# Patient Record
Sex: Female | Born: 1940 | Race: White | Hispanic: No | Marital: Married | Smoking: Never smoker
Health system: Southern US, Community
[De-identification: ages and names within clinical notes are randomized; demographics above are authoritative.]

## PROBLEM LIST (undated history)

## (undated) DIAGNOSIS — I10 Essential (primary) hypertension: Secondary | ICD-10-CM

## (undated) DIAGNOSIS — E039 Hypothyroidism, unspecified: Secondary | ICD-10-CM

## (undated) DIAGNOSIS — M1990 Unspecified osteoarthritis, unspecified site: Secondary | ICD-10-CM

## (undated) HISTORY — DX: Hypothyroidism, unspecified: E03.9

## (undated) HISTORY — PX: ANTERIOR CRUCIATE LIGAMENT REPAIR: SHX115

## (undated) HISTORY — DX: Unspecified osteoarthritis, unspecified site: M19.90

## (undated) HISTORY — PX: CHOLECYSTECTOMY: SHX55

## (undated) HISTORY — PX: ANKLE SURGERY: SHX546

## (undated) HISTORY — PX: EYE SURGERY: SHX253

## (undated) HISTORY — PX: TOTAL HIP ARTHROPLASTY: SHX124

## (undated) HISTORY — PX: CATARACT EXTRACTION: SUR2

## (undated) HISTORY — PX: KNEE ARTHROSCOPY: SHX127

## (undated) HISTORY — DX: Essential (primary) hypertension: I10

---

## 1999-04-12 ENCOUNTER — Other Ambulatory Visit: Admission: RE | Admit: 1999-04-12 | Discharge: 1999-04-12 | Payer: Self-pay | Admitting: Family Medicine

## 2000-04-21 ENCOUNTER — Other Ambulatory Visit: Admission: RE | Admit: 2000-04-21 | Discharge: 2000-04-21 | Payer: Self-pay | Admitting: Family Medicine

## 2001-04-23 ENCOUNTER — Other Ambulatory Visit: Admission: RE | Admit: 2001-04-23 | Discharge: 2001-04-23 | Payer: Self-pay | Admitting: Family Medicine

## 2001-09-07 ENCOUNTER — Emergency Department (HOSPITAL_COMMUNITY): Admission: EM | Admit: 2001-09-07 | Discharge: 2001-09-07 | Payer: Self-pay | Admitting: Emergency Medicine

## 2002-04-29 ENCOUNTER — Other Ambulatory Visit: Admission: RE | Admit: 2002-04-29 | Discharge: 2002-04-29 | Payer: Self-pay | Admitting: Family Medicine

## 2003-06-09 ENCOUNTER — Other Ambulatory Visit: Admission: RE | Admit: 2003-06-09 | Discharge: 2003-06-09 | Payer: Self-pay | Admitting: Family Medicine

## 2004-06-17 ENCOUNTER — Other Ambulatory Visit: Admission: RE | Admit: 2004-06-17 | Discharge: 2004-06-17 | Payer: Self-pay | Admitting: Family Medicine

## 2004-07-29 ENCOUNTER — Ambulatory Visit: Payer: Self-pay | Admitting: Family Medicine

## 2004-08-01 ENCOUNTER — Ambulatory Visit: Payer: Self-pay | Admitting: Family Medicine

## 2004-09-04 ENCOUNTER — Ambulatory Visit: Payer: Self-pay | Admitting: Family Medicine

## 2005-02-20 ENCOUNTER — Ambulatory Visit: Payer: Self-pay | Admitting: Family Medicine

## 2005-02-28 ENCOUNTER — Ambulatory Visit: Payer: Self-pay | Admitting: Cardiology

## 2005-03-17 ENCOUNTER — Ambulatory Visit: Payer: Self-pay

## 2005-03-17 ENCOUNTER — Ambulatory Visit: Payer: Self-pay | Admitting: Cardiology

## 2005-03-20 ENCOUNTER — Ambulatory Visit: Payer: Self-pay | Admitting: Cardiology

## 2005-03-31 ENCOUNTER — Ambulatory Visit: Payer: Self-pay | Admitting: Internal Medicine

## 2005-03-31 ENCOUNTER — Ambulatory Visit (HOSPITAL_COMMUNITY): Admission: RE | Admit: 2005-03-31 | Discharge: 2005-03-31 | Payer: Self-pay | Admitting: Cardiology

## 2005-06-19 ENCOUNTER — Ambulatory Visit: Payer: Self-pay | Admitting: Cardiology

## 2005-07-07 ENCOUNTER — Other Ambulatory Visit: Admission: RE | Admit: 2005-07-07 | Discharge: 2005-07-07 | Payer: Self-pay | Admitting: Family Medicine

## 2005-07-07 ENCOUNTER — Ambulatory Visit: Payer: Self-pay | Admitting: Family Medicine

## 2005-10-17 ENCOUNTER — Ambulatory Visit: Payer: Self-pay | Admitting: Family Medicine

## 2005-12-11 ENCOUNTER — Ambulatory Visit: Payer: Self-pay | Admitting: Cardiology

## 2007-05-03 ENCOUNTER — Ambulatory Visit: Payer: Self-pay | Admitting: Cardiology

## 2007-05-07 ENCOUNTER — Ambulatory Visit: Payer: Self-pay

## 2007-05-07 LAB — CONVERTED CEMR LAB
BUN: 23 mg/dL (ref 6–23)
CO2: 29 meq/L (ref 19–32)
Creatinine, Ser: 0.9 mg/dL (ref 0.4–1.2)
Potassium: 5.3 meq/L — ABNORMAL HIGH (ref 3.5–5.1)
Sodium: 138 meq/L (ref 135–145)

## 2007-08-03 ENCOUNTER — Ambulatory Visit: Payer: Self-pay | Admitting: Cardiology

## 2007-11-30 ENCOUNTER — Ambulatory Visit: Payer: Self-pay | Admitting: Cardiology

## 2008-03-20 ENCOUNTER — Ambulatory Visit: Payer: Self-pay | Admitting: Cardiology

## 2008-05-18 ENCOUNTER — Ambulatory Visit: Payer: Self-pay | Admitting: Cardiology

## 2008-08-08 ENCOUNTER — Ambulatory Visit: Payer: Self-pay | Admitting: Cardiology

## 2008-08-24 ENCOUNTER — Ambulatory Visit: Payer: Self-pay | Admitting: Cardiology

## 2008-08-26 DIAGNOSIS — I10 Essential (primary) hypertension: Secondary | ICD-10-CM | POA: Insufficient documentation

## 2008-08-26 DIAGNOSIS — E119 Type 2 diabetes mellitus without complications: Secondary | ICD-10-CM | POA: Insufficient documentation

## 2008-08-26 DIAGNOSIS — E039 Hypothyroidism, unspecified: Secondary | ICD-10-CM | POA: Insufficient documentation

## 2008-10-13 ENCOUNTER — Ambulatory Visit: Payer: Self-pay | Admitting: Cardiology

## 2008-12-19 ENCOUNTER — Ambulatory Visit: Payer: Self-pay | Admitting: Cardiology

## 2008-12-19 DIAGNOSIS — E669 Obesity, unspecified: Secondary | ICD-10-CM

## 2009-04-11 ENCOUNTER — Encounter (INDEPENDENT_AMBULATORY_CARE_PROVIDER_SITE_OTHER): Payer: Self-pay | Admitting: *Deleted

## 2009-05-28 ENCOUNTER — Encounter: Payer: Self-pay | Admitting: Cardiology

## 2009-06-21 ENCOUNTER — Ambulatory Visit: Payer: Self-pay | Admitting: Cardiology

## 2010-03-21 ENCOUNTER — Telehealth: Payer: Self-pay | Admitting: Cardiology

## 2010-04-17 ENCOUNTER — Ambulatory Visit: Payer: Self-pay | Admitting: Cardiology

## 2010-04-17 DIAGNOSIS — R0602 Shortness of breath: Secondary | ICD-10-CM

## 2010-08-09 ENCOUNTER — Encounter: Payer: Self-pay | Admitting: Cardiology

## 2010-08-09 ENCOUNTER — Ambulatory Visit: Payer: Self-pay | Admitting: Cardiology

## 2010-09-24 NOTE — Progress Notes (Signed)
  Phone Note Call from Patient   Caller: Patient Reason for Call: Talk to Nurse Summary of Call: pt stopped taking bp med corig caused her to feel like she will pass out,  now on diavan, having sob x 2 months, pls advise [phone] Initial call taken by: Glynda Jaeger,  March 21, 2010 3:27 PM

## 2010-09-24 NOTE — Progress Notes (Signed)
Summary: sob x 2 months/ appt 8/24  Phone Note Call from Patient   Caller: Patient Reason for Call: Talk to Nurse Summary of Call: pt stopped taking corig because she felt like she was going to pass out, now on diavan and having sob x 2 months -pls advise [phone] Initial call taken by: Glynda Jaeger,  March 21, 2010 4:16 PM  Follow-up for Phone Call        She needs a follow up appt which could be with an extender. Follow-up by: Rollene Rotunda, MD, [facility],  March 22, 2010 2:15 PM  Additional Follow-up for Phone Call Additional follow up Details #1::        SOB even with sitting in the bed but wants to wait to see Dr Antoine Poche for evaluation.  Some edema as well that doesn't resolve over night.  Attempted to give pt an appointment with PA on Tuesday.  Pt states understanding tp call back or go tot he ED if s/s get worse or change. Additional Follow-up by: Charolotte Capuchin, RN,  March 22, 2010 3:24 PM

## 2010-09-24 NOTE — Assessment & Plan Note (Signed)
Summary: appt at 3:15 OK per [facility]   Visit Type:  Follow-up Primary Provider:  Dr. Dina Rich  CC:  Hypertension.  History of Present Illness: The patient presents for followup of her difficult to control hypertension.  She had been doing relatively well until she had an episode of dizziness and lightheadedness. She noted blood pressures that were in the 100 systolic range. She weaned herself off of her carvedilol. She subsequently developed significant shortness of breath and presented to her endocrinologist. She was found to be tachycardic at that time and she was restarted on her carvedilol. She did have improvement in her symptoms. Her blood pressures have been well controlled and I reviewed a blood pressure diary. She has however had some increased dyspnea. She is not describing resting shortness of breath and doesn't have PND or orthopnea. She is not describing palpitations, presyncope or syncope. She's had no chest pressure, neck or arm discomfort.  Current Medications (verified): 1)  Carvedilol 25 Mg Tabs (Carvedilol) .... Take 1 Tablet By Mouth Twice A Day 2)  Diovan Hct 320-25 Mg Tabs (Valsartan-Hydrochlorothiazide) .Marland Kitchen.. 1 Tab Once Daily 3)  Synthroid 50 Mcg Tabs (Levothyroxine Sodium) .Marland Kitchen.. 1 Tab Once Daily 4)  Metformin Hcl 500 Mg Xr24h-Tab (Metformin Hcl) .... Take 2 Tab Two Times A Day 5)  Lantus 100 Unit/ml Soln (Insulin Glargine) .... Take 65 Units At Bedtime 6)  Amaryl 4 Mg Tabs (Glimepiride) .Marland Kitchen.. 1 Tab Two Times A Day 7)  Humalog Mix 50/50 Kwikpen 50-50 % Susp (Insulin Lispro Prot & Lispro) .... Take 30units Two Times A Day 8)  Torsemide 100 Mg Tabs (Torsemide) .... Takke 1/4 Tab Prn 9)  Prilosec 20 Mg Cpdr (Omeprazole) .... As Needed  Allergies (verified): 1)  ! * Actos 2)  ! * Avandia 3)  ! * Januvia 4)  ! * Altace 5)  ! * Symelin 6)  ! * Byetta 7)  ! * Statins 8)  ! Zetia (Ezetimibe) 9)  ! * Mobic 10)  ! Ultram 11)  ! * Antara 12)  ! Atenolol 13)  ! * Aleve 14)   ! Norvasc 15)  ! * Triamcelene Ointment  Past History:  Past Medical History:  1) Diabetes mellitus x12 years.   2) Hypothyroidism.   3) Hypertension.            Past Surgical History: Reviewed history from 08/26/2008 and no changes required. 1)  Right ankle surgery 2)  Left knee arthroscopic surgery 3)  Left hip replacement,  4)  Right knee surgery.      Review of Systems       As stated in the HPI and negative for all other systems.   Vital Signs:  Patient profile:   70 year old female Height:      68 inches Weight:      263 pounds BMI:     40.13 Pulse rate:   95 / minute Resp:     20 per minute BP sitting:   144 / 64  (right arm)  Vitals Entered By: Marrion Coy, CNA (April 17, 2010 3:02 PM)  Physical Exam  General:  Well developed, well nourished, in no acute distress. Head:  normocephalic and atraumatic Eyes:  PERRLA/EOM intact; conjunctiva and lids normal. Mouth:  Teeth, gums and palate normal. Oral mucosa normal. Neck:  Neck supple, no JVD. No masses, thyromegaly or abnormal cervical nodes. Chest Wall:  no deformities or breast masses noted Lungs:  Clear bilaterally to auscultation and  percussion. Heart:  Non-displaced PMI, chest non-tender; regular rate and rhythm, S1, S2 without murmurs, rubs or gallops. Carotid upstroke normal, no bruit. Normal abdominal aortic size, no bruits. Femorals normal pulses, no bruits. Pedals normal pulses. Trace edema, no varicosities. Abdomen:  Bowel sounds positive; abdomen soft and non-tender without masses, organomegaly, or hernias noted. No hepatosplenomegaly, obese Msk:  Back normal, normal gait. Muscle strength and tone normal. Extremities:  No clubbing or cyanosis. Neurologic:  Alert and oriented x 3. Skin:  Intact without lesions or rashes. Cervical Nodes:  no significant adenopathy Psych:  Normal affect.    EKG  Procedure date:  04/17/2010  Findings:      Sinus rhythm, rate 95, axis within normal limits,  intervals within normal limits, no acute ST-T wave changes  Impression & Recommendations:  Problem # 1:  ESSENTIAL HYPERTENSION, MALIGNANT (ICD-401.0) Her blood pressure is controlled while she is on the meds as listed. I will not make a change though we might titrate upward based on future blood pressure diary readings. She will continue to keep these. She understands the need for weight loss to contribute to her blood pressure control. Orders: EKG w/ Interpretation (93000) TLB-BNP (B-Natriuretic Peptide) (83880-BNPR)  Problem # 2:  DYSPNEA (ICD-786.05) This is an increasing complaint. I will start by checking a BNP level.  Problem # 3:  UNSPECIFIED HYPOTHYROIDISM (ICD-244.9) She has this followed by her endocrinologist and says her labs have been within normal limits.  Patient Instructions: 1)  Your physician recommends that you schedule a follow-up appointment in: 4 months with Dr Antoine Poche 2)  Your physician recommends that you return for lab work today: BNP 3)  Your physician recommends that you continue on your current medications as directed. Please refer to the Current Medication list given to you today.

## 2010-09-26 NOTE — Assessment & Plan Note (Signed)
Summary: 4 MONTH ROV.SL   Visit Type:  Follow-up Primary [place] Charland:  Dr. Dina Rich  CC:  HTN.  History of Present Illness: The patient presents for followup of hypertension and dyspnea. At the last visit I checked a BMP which was normal. She continues to get episodic dyspnea. Discussing this today it is clear that she doesn't watch salt in her diet and doesn't exercise. She says her blood pressure has been controlled. She has not been having any PND or orthopnea. She has not been having any palpitations, presyncope or syncope. She had no chest pressure, neck or arm discomfort. She rarely has to take torsemide for swelling or dyspnea.  Current Medications (verified): 1)  Carvedilol 25 Mg Tabs (Carvedilol) .... Take 1 Tablet By Mouth Twice A Day 2)  Diovan Hct 320-25 Mg Tabs (Valsartan-Hydrochlorothiazide) .Marland Kitchen.. 1 Tab Once Daily 3)  Synthroid 50 Mcg Tabs (Levothyroxine Sodium) .Marland Kitchen.. 1 Tab Once Daily 4)  Metformin Hcl 500 Mg Xr24h-Tab (Metformin Hcl) .... Take 2 Tab Two Times A Day 5)  Lantus 100 Unit/ml Soln (Insulin Glargine) .... Take 65 Units At Bedtime 6)  Amaryl 4 Mg Tabs (Glimepiride) .Marland Kitchen.. 1 Tab Two Times A Day 7)  Humalog Mix 50/50 Kwikpen 50-50 % Susp (Insulin Lispro Prot & Lispro) .... Take 30units Two Times A Day 8)  Torsemide 100 Mg Tabs (Torsemide) .... Takke 1/4 Tab Prn 9)  Prilosec 20 Mg Cpdr (Omeprazole) .... As Needed 10)  Meclizine Hcl 25 Mg Tabs (Meclizine Hcl) .... As Needed 11)  Septra Ds 800-160 Mg Tabs (Sulfamethoxazole-Trimethoprim) .Marland Kitchen.. 1 By Mouth Two Times A Day  Allergies (verified): 1)  ! * Actos 2)  ! * Avandia 3)  ! * Januvia 4)  ! * Altace 5)  ! * Symelin 6)  ! * Byetta 7)  ! * Statins 8)  ! Zetia (Ezetimibe) 9)  ! * Mobic 10)  ! Ultram 11)  ! * Antara 12)  ! Atenolol 13)  ! * Aleve 14)  ! Norvasc 15)  ! * Triamcelene Ointment  Past History:  Past Medical History: Reviewed history from 04/17/2010 and no changes required.  1) Diabetes mellitus  x12 years.   2) Hypothyroidism.   3) Hypertension.            Past Surgical History: Reviewed history from 08/26/2008 and no changes required. 1)  Right ankle surgery 2)  Left knee arthroscopic surgery 3)  Left hip replacement,  4)  Right knee surgery.      Review of Systems       As stated in the HPI and negative for all other systems.   Vital Signs:  Patient profile:   70 year old female Height:      68 inches Weight:      268 pounds BMI:     40.90 Pulse rate:   115 / minute Resp:     16 per minute BP sitting:   139 / 68  (right arm)  Vitals Entered By: Marrion Coy, CNA (August 09, 2010 4:19 PM)  Physical Exam  General:  Well developed, well nourished, in no acute distress. Head:  normocephalic and atraumatic Mouth:  Teeth, gums and palate normal. Oral mucosa normal. Neck:  Neck supple, no JVD. No masses, thyromegaly or abnormal cervical nodes. Lungs:  Clear bilaterally to auscultation and percussion. Heart:  Non-displaced PMI, chest non-tender; regular rate and rhythm, S1, S2 without murmurs, rubs or gallops.  Abdomen:  Bowel sounds positive; abdomen soft and  non-tender without masses, organomegaly, or hernias noted. No hepatosplenomegaly, obese Msk:  Back normal, normal gait. Muscle strength and tone normal. Pulses:  pulses normal in all 4 extremities Extremities:  mild right greater than left lower extremity edema Neurologic:  Alert and oriented x 3. Skin:  Intact without lesions or rashes. Cervical Nodes:  no significant adenopathy Psych:  Normal affect.    EKG  Procedure date:  08/09/2010  Findings:      Sinus tachycardia, rate 100, axis within normal limits, QTC slightly prolonged, low voltage in chest leads, baseline artifact precludes adequate analysis  Impression & Recommendations:  Problem # 1:  DYSPNEA (ICD-786.05) I suspect that this is related predominantly to weight and deconditioning though soft and dietary indiscretion could play a  role. We discussed this further today. I do not believe change in therapy is indicated. Orders: EKG w/ Interpretation (93000)  Problem # 2:  ESSENTIAL HYPERTENSION, MALIGNANT (ICD-401.0) Her blood pressure is well controlled with medicines as listed. No change in therapy is indicated. Continues to need lifestyle changes to include weight loss. Orders: EKG w/ Interpretation (93000)  Problem # 3:  AODM (ICD-250.00) She tells me that her hemoglobin A1c was recently greater than 9 and we discussed the importance of diabetes control. She will follow with her primary physician.  Patient Instructions: 1)  Your physician recommends that you schedule a follow-up appointment in: 1 yr with Dr Antoine Poche 2)  Your physician recommends that you continue on your current medications as directed. Please refer to the Current Medication list given to you today.

## 2011-01-07 NOTE — Assessment & Plan Note (Signed)
[facility]                            CARDIOLOGY OFFICE NOTE   NAME:[name], [name] SHEPHERD                        MRN:          [id number]  DATE:08/03/2007                            DOB:          January 30, 1941    PRIMARY CARE PHYSICIAN:  Dr. Dina Rich.   REASON FOR PRESENTATION:  Evaluate patient with dyspnea.   HISTORY OF PRESENT ILLNESS:  Patient is 70 years old.  She presents for  evaluation of dyspnea.  Since I last saw her, she was on Mobic and  retaining, she thought even more fluid.  This was stopped.  She was  confused about what might be causing increased swelling in her legs and  just general puffiness; therefore, she stopped a lot of her medications,  including her Diovan/HCT.  She is describing more dyspnea with moderate  exertion such as walking 25 yards on level ground.  She is not  describing resting shortness of breath, PND, or orthopnea.  She has not  been describing palpitations, presyncope, or syncope.  She has not been  having any chest discomfort, neck or arm discomfort.   Of note, most recently, I did send the patient for a stress perfusion  study to evaluate chest discomfort.  This demonstrated no evidence of  ischemia or scar.  Her EF is 79%.   Also of note, we have worked up the dyspnea in the past.  There has been  a question of diastolic dysfunction suggested on a cardiopulmonary  stress test.  There was also suggestion of some left ventricular  hypertrophy on an echo in 2006.   PAST MEDICAL HISTORY:  1. Diabetes mellitus x12 years.  2. Hypothyroidism.  3. Hypertension.  4. Right ankle surgery.  5. Left knee arthroscopic surgery.  6. Left hip replacement.  7. Right knee surgery.   ALLERGIES/INTOLERANCES:  ALTACE, ACTOS, AVANDIA, STATIN DRUGS.   MEDICATIONS:  1. Lantus insulin.  2. Glucophage 1000 mg b.i.d.  3. Synthroid 0.05 mg daily.  4. Fish oil.  5. Multivitamin.  6. Amaryl 4 mg b.i.d.  7. Humalog.   REVIEW  OF SYSTEMS:  As stated in the HPI, otherwise negative for other  systems.   PHYSICAL EXAMINATION:  Patient is in no distress.  Blood pressure 190/110, heart rate 104 and regular. Weight 260 pounds.  Body Mass Index 39.  HEENT:  Eyelids unremarkable.  Pupils are equal, round and reactive to  light.  Fundi not visualized.  Oral mucosa unremarkable.  NECK:  No jugular venous distention.  Waveform within normal limits.  Carotid upstroke brisk and symmetric.  No bruits, no thyromegaly.  LYMPHATICS:  No cervical, axillary, or inguinal adenopathy.  LUNGS:  Clear to auscultation bilaterally.  BACK:  No costovertebral angle tenderness.  CHEST:  Unremarkable.  HEART:  PMI not displaced or sustained.  S1 and S2 within normal limits.  No S3, no S4, no clicks, rubs, or murmurs.  ABDOMEN:  Obese, positive bowel sounds.  Normal in frequency and pitch.  No bruits, rebound, guarding.  There are no midline pulsatile masses.  No hepatomegaly, no splenomegaly.  SKIN:  No rashes, no nodules.  EXTREMITIES:  Pulses 2+.  No edema.  Left greater than right lower  extremity pitting edema, 2+ right posterior tibialis, absent left  posterior tibialis.  NEUROLOGIC:  Alert and oriented to person, place, and time.  Cranial  nerves II-XII grossly intact.  Motor grossly intact.   ASSESSMENT/PLAN:  1. Dyspnea:  This is the patient's predominant complaint on this      visit.  Although she has not had an elevated BNP when we last      checked it in 2006, she does have two cardiovascular tests      suggesting diastolic dysfunction.  Her history of hypertension puts      her at great risk for this, and I believe this is playing a role.      I have discussed this with the patient.  She needs blood pressure      control.  She took herself off of her blood pressure medications.      This was addressed below.  I also reviewed salt restriction with      her.  I talked to her about keeping her feet elevated.  Certainly,       weight loss would be important.  She does not snore and does not      have other symptoms consistent with sleep apnea, although she      certainly has the body habitus.  We will consider this diagnosis      going forward.  I did discuss her case today with Dr. Juleen China.  We      reviewed her meds, and he does not see an obvious culprit from her      diabetes medications to be causing any volume retention.  At this      point, we are going to treat the blood pressure.  I talked to Dr.      Juleen China, who is seeing her next week, and if she is still having      dyspnea and swelling with adequate blood pressure control, we might      give her added Lasix.  2. Hypertension:  Blood pressure is elevated today; however, she has      not been taking the Diovan/HCT, and I renewed this prescription at      320 mg/12.5 daily.  She understands weight loss.  Her blood      pressure was fairly easily controlled with this medicine, so it      will be followed going forward.  3. Diabetes:  Per Dr. Juleen China.  4. Chest discomfort:  This is no longer a pressing complaint.  She had      a negative stress perfusion study earlier this year.  5. Followup:  I will see the back in about four months or sooner if      she has any continuing or worsening dyspnea.     Rollene Rotunda, MD, [facility]  Electronically Signed    JH/MedQ  DD: 08/03/2007  DT: 08/03/2007  Job #: [id number]   cc:   Brooke Bonito, M.D.  Dina Rich

## 2011-01-07 NOTE — Assessment & Plan Note (Signed)
[facility]                            CARDIOLOGY OFFICE NOTE   NAME:[name] [name], [name] [name]                        MRN:          [id number]  DATE:03/20/2008                            DOB:          Aug 23, 1941    PRIMARY CARE PHYSICIAN:  Dr. Dina Rich.   REASON FOR PRESENTATION:  Evaluate the patient with difficult-to-control  hypertension and dyspnea.   HISTORY OF PRESENT ILLNESS:  The patient is 71 years old.  At the last  visit, I tried to start atenolol for control of her blood pressure with  a rapid heart rate.  I also discussed the case with Dr. Juleen China.  We have  discussed adding or changing diuretics and he did in deed use Demadex in  place of Lasix.   The patient did not tolerate her atenolol.  She felt like she had leg  cramping.  She has been taking Demadex, but only sporadically as it  makes her urinate a lot.  With this, she has not noticed any change in  her dyspnea.  She is still short of breath with moderate exertion such  as walking 25 yards on level ground.  She is not describing PND or  orthopnea.  Her blood pressure, she reports has been well controlled at  home, and she uses what she thinks is an active blood pressure cuff.  It  is elevated here again in the office.  Of note, she does have evidence  of diastolic dysfunction on echocardiograms.  We have done a dyspnea  workup to include cardiopulmonary stress test and nuclear stress test.  There had been no evidence of ischemia with this or cardiac etiology  other than diastolic dysfunction.   PAST MEDICAL HISTORY:  Diabetes mellitus x12 years, hypothyroidism,  hypertension, right ankle surgery, left knee arthroscopic surgery, left  hip replacement, and right knee surgery.   ALLERGIES:  Intolerances to ALTACE, ACTOS, AVANDIA, and STATIN DRUGS.   MEDICATIONS:  Osteo Bi-Flex, Humalog, Lantus, Diovan HCT 320/25,  Synthroid 50 mcg daily, Amaryl 4 mg b.i.d., Glucophage 100 mg b.i.d.,  and Prilosec over-the-counter.   REVIEW OF SYSTEMS:  As stated in the HPI and otherwise negative for  other systems.   PHYSICAL EXAMINATION:  GENERAL:  The patient is in no acute distress.  VITAL SIGNS:  Blood pressure 150/76 (repeated with a large cuff), heart  rate 106 and regular, weight 260 pounds, body mass index 39.  HEENT:  Eyes unremarkable; pupils equal, round, and reactive to light;  fundi not visualized; oral mucosa unremarkable.  NECK:  No jugular venous distention at 45 degrees; carotid upstrokes  brisk and symmetrical; no bruits, no thyromegaly.  LYMPHATICS:  No cervical, axillary, or inguinal adenopathy.  LUNGS:  Clear to auscultation bilaterally.  BACK:  No costovertebral angle tenderness.  CHEST:  Normal.  HEART:  PMI not displaced or sustained; S1 and S2 within normal limits;  no S3, no S4; no clicks, no rubs, no murmurs.  ABDOMEN:  Obese; positive bowel sounds, normal in frequency and pitch;  no bruits, no rebound, no guarding, and no midline pulsatile  mass; no  organomegaly.  SKIN:  No rashes, no nodules.  EXTREMITIES:  A 2+ pulses; trace bilateral lower extremity edema; 2+  right posterior tibialis, and absent left posterior tibialis.  NEURO:  Oriented to place and time; cranial nerves II-XII grossly  intact; motor grossly intact.   ASSESSMENT/PLAN:  1. Dyspnea.  The patient's dyspnea is unchanged.  The only objective      evidence of her etiology has been Doppler evidence of diastolic      dysfunction.  She was started on Demadex, but is not taking this      routinely because it makes her urinate a lot.  I think that the      treatment should remain use of Demadex; salt restriction, which he      is not doing; weight loss and blood pressure control.  I have      encouraged her to take Demadex daily.  She is having her labs      followed by Dr. Juleen China.  She knows that she needs to have potassium      checked if she begins to take this daily.  2. Hypertension.   Blood pressure has been difficult to control, though      she may have some element of white coat hypertension.  I also think      she may have sleep apnea, and I have encouraged her, and she      consents to get a sleep study.  She has tiredness.  She has      snoring.  She has the body habitus and difficult-to-control      hypertension.  Further management will be based on this.  For now,      she will continue on medications as listed.  3. Hypothyroidism.  She is on thyroid replacement therapy per Dr.      Juleen China and Dr. Sol Passer.  4. Obesity.  She understands need to lose weight with diet and      exercise.  5. Followup.  We will see her back in about 4 months or sooner if      needed.     Rollene Rotunda, MD, [facility]  Electronically Signed    JH/MedQ  DD: 03/20/2008  DT: 03/21/2008  Job #: [id number]   cc:   Ivin Poot, M.D.

## 2011-01-07 NOTE — Assessment & Plan Note (Signed)
[facility]                            CARDIOLOGY OFFICE NOTE   NAME:[name] [name] SCHILTZ                        MRN:          [id number]  DATE:05/03/2007                            DOB:          [birth year]    REFERRING PHYSICIAN:  Brooke Bonito, M.D.   PRIMARY CARE PHYSICIAN:  Dr. Dina Rich.   CONSULTING PHYSICIAN:  Dr. Adela Lank.   REASON FOR CONSULTATION:  Evaluate patient with chest pain.   HISTORY OF PRESENT ILLNESS:  The patient is a pleasant, 70 year old,  white female who I saw a year ago with dyspnea. She did have an  echocardiogram which demonstrated a well-preserved ejection fraction,  some mild concentric left ventricular hypertrophy. However, there was no  clear cardiac etiology identified. Her BNP was only 68. She did have a  cardiopulmonary stress test which suggested a low normal functional  capacity and could not exclude ischemia with diastolic dysfunction  though this is felt to be low possibilities. She had had a stress  perfusion study at another facility prior to this which demonstrated no  ischemia on perfusion imaging.   Since that time, the patient has had no new dyspnea though she gets  chronically short of breath with moderate exertion. She does not have  any resting shortness of breath, denies any PND or orthopnea. She has  been having trouble with her diabetes management and was recently  started on Byetta. When she had the dose increased from 5-10 mg she  noted about 2 hours later severe chest discomfort. This was in her left  upper chest and slightly down to the left arm. It was 12/10. It lasted  all day easing slowly. She had never had discomfort like this before. It  went away spontaneously. She was nauseated and vomited with this. She  described it as a heaviness. There was no diaphoresis. She had no new  shortness of breath. It happened about 3 times, each time in the same  fashion after taking the Byetta. She is  otherwise able to do activities  though she has not been to [place] like she would like. She has not been  able to bring on this discomfort.   PAST MEDICAL HISTORY:  1. Diabetes mellitus x12 years.  2. Hypothyroidism.  3. Hypertension.   PAST SURGICAL HISTORY:  Right ankle surgery, left knee arthroscopic  surgery, left hip replacement, right knee surgery.   ALLERGIES:  ALTACE causes cough. ACTOS, AVANDIA, and STATIN she could  not tolerate. She could not tolerate ZETIA.   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  She is married. She has 2 children. She never smoked cigarettes.   FAMILY HISTORY:  Noncontributory for early coronary artery disease.   REVIEW OF SYSTEMS:  As stated in the HPI. Positive for occasional  headaches, leg pain, leg swelling. Negative for other systems.   PHYSICAL EXAMINATION:  GENERAL:  The patient is in no distress.  VITAL SIGNS:  Blood pressure 178/93, heart rate 86 and regular, weight  260 pounds, body mass index 39.  HEENT:  Eyelids unremarkable. Pupils equal round and reactive to light.  Fundi not visualized. Oral mucosa unremarkable.  NECK:  No jugular venous distention at 45 degrees, carotid upstroke  brisk and symmetric, no bruits, no thyromegaly.  LYMPHATICS:  No cervical, axillary or inguinal adenopathy.  LUNGS:  Clear to auscultation bilaterally.  BACK:  No costovertebral angle tenderness.  CHEST:  Unremarkable.  HEART:  PMI not displaced or sustained, S1 and S2 within normal limits,  no S3, no S4, no clicks, no rubs, no murmurs.  ABDOMEN:  Obese, positive bowel sounds, normal in frequency and pitch,  no bruits, no rebound, no guarding, no midline pulsatile mass, no  hepatomegaly, no splenomegaly.  SKIN:  No rashes, no nodules, slight erythema of the left lower  pretibial area.  EXTREMITIES:  2+ pulses throughout, no edema. Severe left greater than  right lower extremity pitting edema, 2+ right posterior  tibialis, absent  left posterior tibialis. I was unable to palpate popliteals in either  area.  NEUROLOGIC:  Oriented to person, place and time. Cranial nerves II-XII  grossly intact. Motor grossly intact.   EKG:  Sinus rhythm, rate 86, axis within normal limits, intervals within  normal limits, no acute ST-T wave changes.   ASSESSMENT/PLAN:  1. Chest discomfort:  The patient's chest discomfort has some atypical      and some typical features. She certainly has significant      cardiovascular risk factors. She has had borderline perfusion      imaging in the past with no problems with tracer uptake but some      nonspecific changes on the EKG. She has had a questionable CPX.      Therefore, the pretest probability of obstructive coronary disease      is at least moderate. I am going to set her up for an adenosine      perfusion study. Further evaluation will be based on these results.  2. Lower extremity edema. I am going to go ahead and give her 40 mg of      Lasix for the next 3 days along with 20 mEq of potassium. She is      given written instructions to get a BMET when she comes back for      her stress test. She was instructed on avoiding salt and how to      keep her feet elevated. She may benefit from compression stockings      as well. She certainly would benefit from weight loss for this.  3. Hypertension. Blood pressure is elevated today. However, this is an      isolated finding. She takes her blood pressure at home and it has      been in the 120s/30s. This would be ideal for her with her      diabetes. She is instructed to keep a blood pressure log and share      this with doctors Kohut and Dough.   FOLLOWUP:  I will see the patient back in about 3 months or sooner if  she has any symptoms. I will certainly see her sooner if she has any  abnormalities on her stress perfusion study.     Rollene Rotunda, MD, [facility]  Electronically Signed    JH/MedQ  DD: 05/03/2007   DT: 05/03/2007  Job #: [id number]   cc:   Ivin Poot, M.D.

## 2011-01-07 NOTE — Assessment & Plan Note (Signed)
[facility]                            CARDIOLOGY OFFICE NOTE   NAME:[name] [name], [name] [name]                        MRN:          [id number]  DATE:08/24/2008                            DOB:          01/24/1941    PRIMARY CARE Kymani Shimabukuro:  Dr. Dina Rich   REASON FOR PRESENTATION:  Evaluate the patient with difficult control  hypertension.   HISTORY OF PRESENT ILLNESS:  The patient returns for followup of the  above.  Since I last saw her, she has had no new problems.  She did  increase her carvedilol to 12.5 mg twice a day.  She had no problems  with lightheadedness, presyncope, or syncope.  However, she has not had  any improvement in her blood pressure.  She brought her blood pressure  diary and her systolics are typically in the 160s-180s.  The diastolics  are well controlled.  She has had no bradyarrhythmias.  She is still  getting around with a walker rehabbing from recent surgery.   Of note, she brought an herbal supplement catalogue with her.  She has  ordered one of the products.  I went over extensively the ingredients  and found nothing that was absolutely contraindicated in her situation.  It was a licorice root, which could have some effect on blood pressure  and I told her to be very cautious about any nutraceutical medications.   PAST MEDICAL HISTORY:  1. Diabetes mellitus x12 years.  2. Hypothyroidism.  3. Hypertension times a couple of years.  4. Right ankle surgery.  5. Left knee arthroscopic surgery.  6. Left hip replacement.  7. Right hip replacement.  8. Right knee surgery.  9. Cholecystectomy.   ALLERGIES/INTOLERANCES:  ACTOS, ALTACE, AVANDIA, and STATIN DRUGS.   MEDICATIONS:  1. Carvedilol 12.5 mg b.i.d.  2. Cysteine.  3. Humalog.  4. Lantus.  5. Diovan HCT 320/25.  6. Synthroid 50 mcg daily.  7. Amaryl 4 mg b.i.d.  8. Glucophage 1000 mg b.i.d.  9. Prilosec.   REVIEW OF SYSTEMS:  As stated in the HPI and otherwise  negative for  other systems.   PHYSICAL EXAMINATION:  GENERAL:  The patient is in no distress.  VITAL SIGNS:  Blood pressure 174/77, heart rate 101 and regular, weight  253 pounds, and body mass index 39.  HEENT:  Eyelids are unremarkable, pupils are equal, round, and reactive  to light, fundi not visualized, oral mucosa unremarkable.  NECK:  No jugular distention at 90 degrees (the patient examined while  seated in the chair), carotid upstroke brisk and symmetrical.  No bruits  or thyromegaly.  LYMPHATICS:  No cervical, axillary, or inguinal.  LUNGS:  Clear to auscultation bilaterally.  BACK:  No costovertebral angle tenderness.  CHEST:  Unremarkable.  HEART:  PMI not displaced or sustained, S1 and S2 within normal limits,  no S3, no S4.  No clicks, no rubs, no murmurs.  ABDOMEN:  Morbidly obese, positive bowel sounds normal in frequency and  pitch, no rebound or guarding, I would be unable to appreciate  hepatomegaly, splenomegaly, or midline pulsatile mass  in this position.  SKIN:  No rashes, no nodules.  EXTREMITIES:  Pulses 2+ throughout, no edema, no cyanosis, no clubbing.  NEUROLOGIC:  Oriented to person, place, and time.  Cranial nerves II  through XII grossly intact, motor grossly intact.   ASSESSMENT AND PLAN:  1. Hypertension.  Blood pressure is still not controlled.  I am going      to increase her carvedilol 25 mg twice a day.  We will continue to      follow her blood pressure with home blood pressure readings.  2. Diet.  The patient and I discussed her dietary supplement at great      length as I reviewed this with her.  She is cautioned about the use      of this product as it may interfere with blood pressure.  3. Diabetes per Dr. Sol Passer.  4. Hypothyroidism.  I will check a TSH today as she says it has not      been done in a while.  5. Followup:  I will see her back in the about 1 month to see if she      needs further med titration.     Rollene Rotunda, MD,  [facility]  Electronically Signed    JH/MedQ  DD: 08/24/2008  DT: 08/25/2008  Job #: [id number]   cc:   Dina Rich

## 2011-01-07 NOTE — Assessment & Plan Note (Signed)
[facility]                            CARDIOLOGY OFFICE NOTE   NAME:[name] [name], [name] [name]                        MRN:          [id number]  DATE:11/30/2007                            DOB:          August 13, 1941    PRIMARY CARE PHYSICIAN:  Dr. Dina Rich.   REASON FOR PRESENTATION:  Evaluate patient with hypertension and  dyspnea.   HISTORY OF PRESENT ILLNESS:  The patient presents for follow-up of the  above.  I saw her last in December.  At that time, we reinitiated her  Diovan HCT which she had stopped.  We talked about weight loss and salt  restriction.  She says since that time she has been feeling much better.  She is still dyspneic, although it is vastly improved.  She has much  less lower extremity swelling than she had before.  She has had no chest  discomfort, neck or arm discomfort.  She has had no palpitations,  presyncope or syncope.  She does come back and report that her blood  pressure is typically in the 150s still.  Of note, she had a  cholecystectomy on February 11 and had no problems with this.   PAST MEDICAL HISTORY:  Diabetes mellitus x12 years, hypothyroidism,  hypertension, right ankle surgery, left knee arthroscopic surgery, left  hip replacement, right knee surgery, cholecystectomy.   ALLERGIES/INTOLERANCES:  ALTACE, ACTOS, AVANDIA, STATIN DRUGS.   MEDICATIONS:  1. Glucophage 1000 mg b.i.d.  2. Amaryl 4 mg b.i.d.  3. Synthroid 50 mcg daily.  4. Diovan HCT 320/25 daily.  5. Lantus as directed.  6. Humalog.  7. Osteo Bi-Flex.   REVIEW OF SYSTEMS:  As stated in the HPI and otherwise negative for  other systems.   PHYSICAL EXAMINATION:  The patient is in no acute distress.  Blood  pressure 162/91, heart rate 100 and regular, weight 249 pounds.  HEENT:  Eyelids unremarkable, pupils equal, round and reactive to light.  Fundi not visualized, oral mucosa unremarkable.  NECK:  No jugular venous distention at 45 degrees. Carotid  upstroke  brisk and symmetric, no bruits, no thyromegaly.  LYMPHATICS:  No cervical, axillary or inguinal adenopathy.  LUNGS:  Clear to auscultation bilaterally.  BACK:  No costovertebral angle tenderness.  CHEST:  Unremarkable.  HEART:  PMI not displaced or sustained, S1 and S2 within normal.  No S3,  no S4, no clicks, no rubs, no murmurs.  ABDOMEN:  Obese, positive bowel sounds, normal in frequency and pitch,  no bruits, no rebound, no guarding, no midline pulsatile mass, no  organomegaly.  SKIN:  No rashes, no nodules.  EXTREMITIES:  2+ pulses, no cyanosis, no clubbing, mild bilateral lower  extremity edema.  NEURO:  Oriented to person, place and time. Cranial nerves II-XII  grossly intact, motor grossly intact.   EKG:  Sinus tachycardia, rate 100, axis within normal limits, intervals  within normal limit, no acute ST-wave changes.   ASSESSMENT/PLAN:  1. Hypertension.  The patient's blood pressure is still elevated.      With the tachycardia, I am going to add atenolol 25  mg b.i.d. This      can be titrated as needed for better blood pressure control.  She      will keep a diary.  She will let Dr. Sol Passer or myself know if she is      having any problems with this or needs further med titration.  She      continues to need therapeutic lifestyle changes with weight loss to      bring her pressure down as well.  2. Dyspnea.  This is much improved.  I think this was probably related      somewhat to deconditioning and her hypertension and diastolic      dysfunction.  Again will concentrate on therapeutic lifestyle      changes and blood pressure control.  3. Diabetes per Dr. Juleen China.  4. Chest discomfort.  She had a negative stress perfusion study last      year.  No further cardiovascular testing is suggested.  She is      currently not complaining of this.  5. Follow-up.  I will see her back in 6 months or sooner if needed.     Rollene Rotunda, MD, [facility]  Electronically  Signed    JH/MedQ  DD: 11/30/2007  DT: 12/01/2007  Job #: [id number]   cc:   Dina Rich

## 2011-01-07 NOTE — Assessment & Plan Note (Signed)
[facility]                            CARDIOLOGY OFFICE NOTE   NAME:[name] [name] MAEDA                        MRN:          [id number]  DATE:08/08/2008                            DOB:          Dec 30, 1940    PRIMARY CARE PHYSICIAN:  Dr. Dina Rich.   REASON FOR PRESENTATION:  Evaluate the patient with difficult-to-control  hypertension.   HISTORY OF PRESENT ILLNESS:  The patient presents for followup.  I last  saw her prior to a hip surgery which she had recently.  This was a right  hip and she has done well with this.  She has been having problems with  difficult-to-treat blood pressure.  In fact, she has had physical  therapy held a couple of times because of this.  Her systolics are often  in the 170s, 180s, or 190s.  Her diastolic is usually well controlled.  She has had a little bit of dyspnea with exertion, but again she has not  been as active since the surgery.  She has been on Coumadin.  She does  not have any chest pressure, neck or arm discomfort.  She does not have  any palpitation, presyncope, or syncope.  She denies any PND or  orthopnea.  She has noticed a couple of episodes of left arm numbness  and left leg numbness that goes away quickly.  She has had no loss of  voice or vision.   PAST MEDICAL HISTORY:  Diabetes mellitus x12 years, hypothyroidism,  hypertension times a couple of years, right ankle surgery, left knee  arthroscopic surgery, left hip replacement, right hip replacement, right  knee surgery, and cholecystectomy.   ALLERGIES/INTOLERANCES:  ACTOS, ALTACE, AVANDIA, and STATIN DRUGS.   MEDICATIONS:  1. Carvedilol 6.25 mg b.i.d. (recently started).  2. Systane eye drops.  3. Humalog.  4. Lantus.  5. Diovan 320/25 daily.  6. Synthroid 50 mcg daily.  7. Amaryl 4 mg b.i.d.  8. Glucophage 1000 mg b.i.d.  9. Prilosec over the counter.   REVIEW OF SYSTEMS:  As stated in the HPI and otherwise negative for  other  systems.   PHYSICAL EXAMINATION:  GENERAL:  The patient is in no distress.  VITAL SIGNS:  Blood pressure 180/80, heart rate 100 and regular, weight  256 pounds, body mass index 39.  HEENT:  Eyes unremarkable, pupils equal, round, and reactive to light,  fundi not visualized, oral mucosa unremarkable.  NECK:  No jugular venous distention at 45 degrees, carotid upstroke  brisk and symmetric, no bruits, no thyromegaly.  LYMPHATICS:  No cervical, axillary, or inguinal adenopathy.  LUNGS:  Clear to auscultation bilaterally.  BACK:  No costovertebral angle tenderness.  CHEST:  Unremarkable.  HEART:  PMI not displaced or sustained, S1 and S2 within normal limits,  no S3, no S4, no clicks, no rubs, no murmurs.  ABDOMEN:  Obese, positive bowel sounds normal in frequency and pitch, no  bruits, no rebound, no guarding, no midline pulsatile mass, no  hepatomegaly, no splenomegaly.  SKIN:  No rashes, no nodules.  EXTREMITIES:  Pulses 2+ throughout, no edema,  no cyanosis, no clubbing.  NEUROLOGIC:  Oriented to person, place, and time, cranial nerves II  through XII grossly intact, motor grossly intact.   EKG:  Sinus tachycardia, rate 100, axis within normal limits, intervals  within normal limits, no acute ST-T wave changes.   ASSESSMENT AND PLAN:  1. Hypertension.  Her blood pressure is not well controlled.  At this      point, I am going to increase her carvedilol to 12.5 mg twice a day      though it was just started.  We will continue the other meds as      listed.  She will keep a blood pressure diary.  I would like to see      her back in a couple of weeks since she was having some trouble      with this.  I think she could participate in physical therapy as      long as her blood pressure is not above 190/100 or so when they      start.  If it is high, they should repeat it during exercise.  2. Sleep apnea.  The patient has some mild sleep apnea on a sleep      study.  She was told that  she should try CPAP, but she does not      want to.  She says she cannot sleep at all while taking that test      and does not think it was accurate.  3. Diabetes, per Dr. Sol Passer.  4. Hypothyroidism.  This was also followed by Dr. Sol Passer.  5. Followup.  Again, I will see her in a couple of weeks.     Rollene Rotunda, MD, [facility]  Electronically Signed    JH/MedQ  DD: 08/08/2008  DT: 08/09/2008  Job #: [id number]   cc:   Dina Rich

## 2011-01-07 NOTE — Assessment & Plan Note (Signed)
[facility]                            CARDIOLOGY OFFICE NOTE   NAME:[name] [name], [name] [name]                        MRN:          [id number]  DATE:05/18/2008                            DOB:          [birth year]    REFERRING PHYSICIAN:  Tomasa Hose, MD.   REASON FOR CONSULTATION:  Preoperative evaluation, the patient with  difficult to control hypertension and cardiovascular risk factors.   HISTORY OF PRESENT ILLNESS:  The patient is a pleasant 70 year old white  female who I have seen for management of difficult to control  hypertension and other risk factors.  She is scheduled to have a right  total hip replacement and is referred here for preoperative clearance.   Since I last saw her, she has become more and more limited with hip  pain.  She is still able to do her chores of daily living (greater than  5 METS).  With this, she does not get any chest pressure, neck or arm  discomfort.  She does not have any palpitation, presyncope, or syncope.  She continues to have some dyspnea with exertion.  She did have a sleep  study and was told that there was some moderate apnea, but she did not  need CPAP.  She thought that she did not sleep at all during this test,  questions, and results.  She does take her blood pressure at home, it  has been in 120s/70s consistently.  She is taking the meds as listed  below.   The patient has had a cardiac evaluation that include a nuclear stress  test in September 2008.  There was no scar or ischemia.  She has a  normal ejection fraction.  She has had an echocardiogram in 2006 with  some mild concentric left ventricular hypertrophy.  She has had no new  symptoms since her stress perfusion study.   PAST MEDICAL HISTORY:  Diabetes mellitus x12 years, hypothyroidism, and  hypertension times couple of years.   PAST SURGICAL HISTORY:  Right ankle surgery, left knee arthroscopic  surgery, left hip replacement, repeat right knee  surgery, and  cholecystectomy.   ALLERGIES/INTOLERANCES:  ACTOS, ALTACE, AVANDIA, and STATIN DRUGS.   MEDICATIONS:  1. Glucophage 1000 mg b.i.d.  2. Prilosec over the counter.  3. Amaryl 4 mg b.i.d.  4. Synthroid 50 mcg daily.  5. Diovan HCT 320/25 daily.  6. Lantus.  7. Humalog.  8. Systane eye drops.   SOCIAL HISTORY:  The patient is married.  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  She has 2 children.  She has never smoked  cigarettes.   FAMILY HISTORY:  Noncontributory for early coronary artery disease.   REVIEW OF SYSTEMS:  As stated in the HPI and positive for  gastroesophageal reflux, hiatal hernia, swelling in her legs, feet, and  ankles (she has not taken her Lasix in a while because she has had  doctor's appointments).  Negative for all other systems.   PHYSICAL EXAMINATION:  GENERAL:  The patient is pleasant and in no  distress.  VITAL SIGNS:  Blood  pressure 155/83, heart rate 99 and regular, weight  265 pounds, and body mass index 40.  HEENT:  Eyelids are unremarkable; pupils equal, round, and reactive to  light; fundi not visualized; and oral mucosa unremarkable.  NECK:  No jugular venous distension at 45 degrees, carotid upstroke  brisk and symmetrical, no bruits, and no thyromegaly.  LYMPHATICS:  No cervical, axillary, or inguinal adenopathy.  LUNGS:  Clear to auscultation bilaterally.  BACK:  No costovertebral angle tenderness.  CHEST:  Unremarkable.  HEART:  PMI not displaced or sustained, S1 and S2 within normal limits,  no S3, no S4, no clicks, no rubs, no murmurs.  ABDOMEN:  Obese, positive bowel sounds, normal in frequency and pitch,  no bruits, no rebound, no guarding, no midline pulsatile mass, no  hepatomegaly, and no splenomegaly.  SKIN:  No rashes, no nodules.  EXTREMITIES:  Pulses 2+ throughout, moderate bilateral ankle edema.  No  cyanosis, no clubbing.  NEUROLOGIC:  Oriented to person, place, and time.  Cranial nerves II  through XII  grossly intact.  Motor grossly intact.   EKG - sinus tachycardia, rate 104, axis within normal limits, intervals  within normal limits, and no acute ST-T wave changes.   ASSESSMENT AND PLAN:  1. Preoperative clearance.  The patient is at acceptable risk for the      planned surgery according to ACC/AHA guidelines.  She has no high      risk features.  She had a negative stress perfusion study last      year.  She has no new symptoms.  She is going for a moderate risk      procedure from a cardiovascular standpoint.  No further      cardiovascular testing is suggested.  She should remain in the meds      as listed.  2. Sleep apnea.  She does have moderate sleep apnea and the      Anesthesiology should be aware of this.  Apparently, she do not      need treatment and I will refer her to primary care physician.  3. Hypertension.  Blood pressure at home is much better controlled      than it had been.  So, it is little high in the office, but she      does have an accurate blood pressure cuff and I believe, these      readings.  She should continue the meds as listed.  4. Tachycardia.  The patient did not tolerate beta-blockers in the      past.  She has a slight resting tachycardia.  She has had normal      thyroid function apparently.  No specific therapy is warranted.  5. Obesity.  She understands the need to lose weight with diet and      exercise.  6. Followup.  I will see her in about 6 months or sooner if needed.     Rollene Rotunda, MD, [facility]  Electronically Signed    JH/MedQ  DD: 05/18/2008  DT: 05/19/2008  Job #: [id number]   cc:   Tomasa Hose

## 2011-01-07 NOTE — Assessment & Plan Note (Signed)
[facility]                            CARDIOLOGY OFFICE NOTE   NAME:Oliff, MYCAH MCDOUGALL                        MRN:          [id number]  DATE:10/13/2008                            DOB:          29-Oct-1940    REASON FOR PRESENTATION:  Evaluate the patient with difficult-to-control  hypertension.   HISTORY OF PRESENT ILLNESS:  The patient presents for followup.  At the  last appointment, I increased her carvedilol to 25 mg twice a day.  She  did this for a while, but the prescription did not go through to her  pharmacy, so she did not have enough pills.  She did keep a nice blood  pressure diary.  When she increased the carvedilol, her blood pressure  did go down, though still not at target, and actually has not gone up  quite as high when she has come back to 12.5 mg twice a day.  She  continues to lose a little weight as she becomes more active recovering  from her hip surgery.  She does not have any chest discomfort, neck or  arm discomfort.  She has no new shortness of breath, PND, or orthopnea.  She did have one night, where she was dyspneic and took a dose of  Demadex with improvement.   PAST MEDICAL HISTORY:  Difficult-to-control hypertension, diabetes  mellitus x12 years, hypothyroidism, right ankle surgery, left knee  arthroscopic surgery, left hip replacement, right hip replacement, right  knee surgery, and cholecystectomy.   ALLERGIES/INTOLERANCES:  ACTOS, ALTACE, AVANDIA, AND STATIN DRUGS.   MEDICATIONS:  1. Carvedilol 25 mg b.i.d.  (She is currently taking half of that).  2. Systane eye drops.  3. Humalog.  4. Lantus.  5. Diovan HCT 320/25 daily.  6. Synthroid 50 mcg daily.  7. Amaryl 4 mg b.i.d.  8. Glucophage 1000 mg b.i.d.   REVIEW OF SYSTEMS:  As stated in the HPI and otherwise negative for all  other systems.   PHYSICAL EXAMINATION:  GENERAL:  The patient is in no distress.  VITAL SIGNS:  Blood pressure 148/72, heart rate 72  and regular, weight  250 pounds.  HEENT:  Eyelids unremarkable; pupils equal, round, and reactive to  light; fundi not visualized; oral mucosa unremarkable.  NECK:  No jugular venous distention at 45 degrees; carotid upstroke  brisk and symmetric; no bruits, no thyromegaly.  LYMPHATIC:  No adenopathy.  LUNGS:  Clear to auscultation bilaterally.  HEART:  PMI not displaced or sustained; S1 and S2 within normal limits;  no S3, no S4; no clicks, no rubs, no murmurs.  ABDOMEN:  Obese; positive bowel sounds, normal in frequency and pitch;  no bruits, no rebound, no guarding; no midline pulsatile mass; no  organomegaly.  EXTREMITIES:  2+ pulses, no edema.   ASSESSMENT AND PLAN:  1. Hypertension.  Blood pressure is still not well controlled.  I am      going to have her go on the 25 mg twice a day of her carvedilol.  I      think she is going to get a little bit  more blood pressure control,      so I am also going add 2.5 mL of Norvasc to her current regimen.      Hopefully, with weight loss, she will also get down to her target.  2. Diabetes, per Dr. Sol Passer.  3. Morbid obesity.  Hopefully, again she will lose weight as she      becomes more active recovering from her hip surgery.  4. Followup.  I see her back in 2 months for the next med titration.      She will keep a blood pressure diary.     Rollene Rotunda, MD, [facility]  Electronically Signed    JH/MedQ  DD: 10/13/2008  DT: 10/14/2008  Job #: [id number]   cc:   Dina Rich

## 2011-08-06 ENCOUNTER — Encounter: Payer: Self-pay | Admitting: Cardiology

## 2011-08-07 ENCOUNTER — Ambulatory Visit: Payer: Self-pay | Admitting: Cardiology

## 2011-08-20 ENCOUNTER — Ambulatory Visit (INDEPENDENT_AMBULATORY_CARE_PROVIDER_SITE_OTHER): Payer: Medicare Other | Admitting: Cardiology

## 2011-08-20 ENCOUNTER — Encounter: Payer: Self-pay | Admitting: Cardiology

## 2011-08-20 VITALS — BP 128/68 | HR 113 | Ht 69.0 in | Wt 253.0 lb

## 2011-08-20 DIAGNOSIS — E669 Obesity, unspecified: Secondary | ICD-10-CM

## 2011-08-20 DIAGNOSIS — R0989 Other specified symptoms and signs involving the circulatory and respiratory systems: Secondary | ICD-10-CM

## 2011-08-20 DIAGNOSIS — I10 Essential (primary) hypertension: Secondary | ICD-10-CM

## 2011-08-20 DIAGNOSIS — R0600 Dyspnea, unspecified: Secondary | ICD-10-CM

## 2011-08-20 DIAGNOSIS — E119 Type 2 diabetes mellitus without complications: Secondary | ICD-10-CM

## 2011-08-20 DIAGNOSIS — R0602 Shortness of breath: Secondary | ICD-10-CM

## 2011-08-20 NOTE — Patient Instructions (Signed)
Your physician wants you to follow-up in: ONE YEAR  You will receive a reminder letter in the mail two months in advance. If you don't receive a letter, please call our office to schedule the follow-up appointment.   Your physician recommends that you return for lab work in: TODAY  

## 2011-08-20 NOTE — Assessment & Plan Note (Signed)
This remains a chronic problem.  I will check a BNP level. She's had a normal ejection fraction previously. (Last stress test 2008 with an EF of 79% and no evidence of ischemia). However, with her tachycardia and dyspnea I want to do a simple screening test for LV dysfunction. I will have a low threshold for echocardiography.

## 2011-08-20 NOTE — Assessment & Plan Note (Signed)
She has been very intolerant of multiple medications. She's getting some dizziness which may be related to her current antihypertensives. However, she needs to continue these as her blood pressure would be too high without this.

## 2011-08-20 NOTE — Assessment & Plan Note (Signed)
I will plan a screening stress test probably next year given her long-standing diabetes. She couldn't exercise for this would need to be [employer].

## 2011-08-20 NOTE — Progress Notes (Signed)
HPI The patient presents for follow up of HTN.  In the past year she has had no acute cardiac complaints.  She is limited by multiple complaints including dizziness. She describes some orthostasis but she's not had any presyncope or syncope. This has been a chronic problem. She also has some joint pains and neck stiffness which she ascribes to her Victoza.  With these limitations she does not get very active. With the minimal activity that she does she denies any chest pressure, neck or arm discomfort. She has no new shortness of breath, PND or orthopnea though she does get dyspneic with mild exertion which has been a chronic problem. She's had some lower extremity swelling bilaterally. This seems to be chronic. She's not having any new palpitations.  Current Outpatient Prescriptions  Medication Sig Dispense Refill  . carvedilol (COREG) 25 MG tablet Take 25 mg by mouth 2 (two) times daily with a meal.        . glimepiride (AMARYL) 4 MG tablet Take 4 mg by mouth 2 (two) times daily.        . insulin glargine (LANTUS) 100 UNIT/ML injection Inject 30 Units into the skin at bedtime.        . Insulin Lispro Prot & Lispro (HUMALOG MIX 50/50 PEN [place]) Inject 25 Units into the skin 2 (two) times daily.        Marland Kitchen levothyroxine (SYNTHROID, LEVOTHROID) 50 MCG tablet Take 50 mcg by mouth daily.        . Liraglutide (VICTOZA [place]) Inject 1.8 mg into the skin daily.        . metFORMIN (GLUCOPHAGE-XR) 500 MG 24 hr tablet Take 1,000 mg by mouth 2 (two) times daily.        Marland Kitchen omeprazole (PRILOSEC) 20 MG capsule Take 20 mg by mouth daily.        . valsartan-hydrochlorothiazide (DIOVAN-HCT) 320-25 MG per tablet Take 1 tablet by mouth daily.          Past Medical History  Diagnosis Date  . Diabetes mellitus   . Hypothyroidism   . Hypertension     Past Surgical History  Procedure Date  . Ankle surgery     right  . Knee arthroscopy     left  . Total hip arthroplasty     left  . Knee surgery     right     ROS:  As stated in the HPI and negative for all other systems.  PHYSICAL EXAM BP 128/68  Pulse 113  Ht 5\' 9"  (1.753 m)  Wt 253 lb (114.76 kg)  BMI 37.36 kg/m2 GENERAL:  Well appearing HEENT:  Pupils equal round and reactive, fundi not visualized, oral mucosa unremarkable NECK:  No jugular venous distention, waveform within normal limits, carotid upstroke brisk and symmetric, no bruits, no thyromegaly LYMPHATICS:  No cervical, inguinal adenopathy LUNGS:  Clear to auscultation bilaterally BACK:  No CVA tenderness CHEST:  Unremarkable HEART:  PMI not displaced or sustained,S1 and S2 within normal limits, no S3, no S4, no clicks, no rubs, no murmurs ABD:  Flat, positive bowel sounds normal in frequency in pitch, no bruits, no rebound, no guarding, no midline pulsatile mass, no hepatomegaly, no splenomegaly EXT:  2 plus pulses throughout, mild bilateral ankle edema, no cyanosis no clubbing SKIN:  No rashes no nodules NEURO:  Cranial nerves II through XII grossly intact, motor grossly intact throughout PSYCH:  Cognitively intact, oriented to person place and time   EKG: Sinus tachycardia, rate  113, low-voltage in the chest leads, poor anterior R wave progression, QTC prolonged, no acute ST-T wave changes. 08/20/2011   ASSESSMENT AND PLAN

## 2011-08-20 NOTE — Assessment & Plan Note (Signed)
I have prescribed the [employer].

## 2012-08-24 ENCOUNTER — Encounter: Payer: Self-pay | Admitting: Cardiology

## 2012-08-24 ENCOUNTER — Ambulatory Visit (INDEPENDENT_AMBULATORY_CARE_PROVIDER_SITE_OTHER): Payer: Medicare Other | Admitting: Cardiology

## 2012-08-24 VITALS — BP 144/76 | HR 89 | Ht 69.0 in | Wt 267.4 lb

## 2012-08-24 DIAGNOSIS — I10 Essential (primary) hypertension: Secondary | ICD-10-CM

## 2012-08-24 DIAGNOSIS — R0602 Shortness of breath: Secondary | ICD-10-CM

## 2012-08-24 DIAGNOSIS — E669 Obesity, unspecified: Secondary | ICD-10-CM

## 2012-08-24 DIAGNOSIS — E119 Type 2 diabetes mellitus without complications: Secondary | ICD-10-CM

## 2012-08-24 NOTE — Patient Instructions (Addendum)
The current medical regimen is effective;  continue present plan and medications.  Your physician has requested that you have a lexiscan myoview. For further information please visit [email or web address]. Please follow instruction sheet, as given.  Follow up will be based on these results 

## 2012-08-24 NOTE — Progress Notes (Signed)
HPI The patient presents for follow up of HTN.  In the past year she has had no acute cardiac complaints.  She continues to be limited by multiple complaints including dizziness.  However, these are not changed from last year. She does occasionally have some shortness of breath but this is mild. She's not describing is much dyspnea as she did previously. She's not describing PND or orthopnea. She's not having any palpitations. Though she has been dizzy she's had no presyncope or syncope. She has some chronic mild lower extremity swelling. She is limited by joint pains.  Current Outpatient Prescriptions  Medication Sig Dispense Refill  . carvedilol (COREG) 25 MG tablet Take 25 mg by mouth 2 (two) times daily with a meal.        . glimepiride (AMARYL) 4 MG tablet Take 4 mg by mouth 2 (two) times daily.        . insulin glargine (LANTUS) 100 UNIT/ML injection Inject 30 Units into the skin at bedtime.        . Insulin Lispro Prot & Lispro (HUMALOG MIX 50/50 PEN [place]) Inject 25 Units into the skin 2 (two) times daily.        Marland Kitchen levothyroxine (SYNTHROID, LEVOTHROID) 50 MCG tablet Take 50 mcg by mouth daily.        . metFORMIN (GLUCOPHAGE-XR) 500 MG 24 hr tablet Take 1,000 mg by mouth 2 (two) times daily.        Marland Kitchen omeprazole (PRILOSEC) 20 MG capsule Take 20 mg by mouth daily.        . valsartan-hydrochlorothiazide (DIOVAN-HCT) 320-25 MG per tablet Take 1 tablet by mouth daily.        Marland Kitchen NOVOFINE 30G X 8 MM MISC         Past Medical History  Diagnosis Date  . Diabetes mellitus   . Hypothyroidism   . Hypertension     Past Surgical History  Procedure Date  . Ankle surgery     right  . Knee arthroscopy     left  . Total hip arthroplasty     left  . Knee surgery     right    ROS:  As stated in the HPI and negative for all other systems.  PHYSICAL EXAM BP 144/76  Pulse 89  Ht 5\' 9"  (1.753 m)  Wt 267 lb 6.4 oz (121.292 kg)  BMI 39.49 kg/m2 GENERAL:  Well appearing NECK:  No jugular  venous distention, waveform within normal limits, carotid upstroke brisk and symmetric, no bruits, no thyromegaly LYMPHATICS:  No cervical, inguinal adenopathy LUNGS:  Clear to auscultation bilaterally BACK:  No CVA tenderness CHEST:  Unremarkable HEART:  PMI not displaced or sustained,S1 and S2 within normal limits, no S3, no S4, no clicks, no rubs, no murmurs ABD:  Flat, positive bowel sounds normal in frequency in pitch, no bruits, no rebound, no guarding, no midline pulsatile mass, no hepatomegaly, no splenomegaly EXT:  2 plus pulses throughout, mild right greater than left bilateral ankle edema, no cyanosis no clubbing  EKG: Sinus rhythm rate 89 low-voltage in the chest leads, poor anterior R wave progression, no acute ST-T wave changes. 08/24/2012   ASSESSMENT AND PLAN  DYSPNEA -  This remains a chronic problem. BNP was perfectly normal last year. I don't think that further cardiovascular testing is suggested this point.   OBESITY, UNSPECIFIED -  I have prescribed the [employer] in the past but she has been unable to follow through with that.  ESSENTIAL HYPERTENSION, MALIGNANT -  Her blood pressure at home is typically well controlled. No change in therapy is indicated. She needs continued attempts at [facility] (therapeutic lifestyle changes).   AODM -  Given the patient's long-standing diabetes stress testing is indicated. However, she would not be able to walk on a treadmill.  Therefore, she will have a  [employer].

## 2012-09-06 ENCOUNTER — Ambulatory Visit (HOSPITAL_COMMUNITY): Payer: Medicare Other | Attending: Cardiovascular Disease | Admitting: Radiology

## 2012-09-06 VITALS — BP 144/84 | HR 87 | Ht 69.0 in | Wt 267.0 lb

## 2012-09-06 DIAGNOSIS — R0789 Other chest pain: Secondary | ICD-10-CM | POA: Insufficient documentation

## 2012-09-06 DIAGNOSIS — R0609 Other forms of dyspnea: Secondary | ICD-10-CM | POA: Insufficient documentation

## 2012-09-06 DIAGNOSIS — R0602 Shortness of breath: Secondary | ICD-10-CM

## 2012-09-06 DIAGNOSIS — E119 Type 2 diabetes mellitus without complications: Secondary | ICD-10-CM | POA: Insufficient documentation

## 2012-09-06 DIAGNOSIS — R0989 Other specified symptoms and signs involving the circulatory and respiratory systems: Secondary | ICD-10-CM | POA: Insufficient documentation

## 2012-09-06 DIAGNOSIS — R079 Chest pain, unspecified: Secondary | ICD-10-CM

## 2012-09-06 DIAGNOSIS — R000 Tachycardia, unspecified: Secondary | ICD-10-CM | POA: Insufficient documentation

## 2012-09-06 DIAGNOSIS — I10 Essential (primary) hypertension: Secondary | ICD-10-CM | POA: Insufficient documentation

## 2012-09-06 DIAGNOSIS — R42 Dizziness and giddiness: Secondary | ICD-10-CM | POA: Insufficient documentation

## 2012-09-06 MED ORDER — TECHNETIUM TC 99M SESTAMIBI GENERIC - CARDIOLITE
30.0000 | Freq: Once | INTRAVENOUS | Status: AC | PRN
  Administered 2012-09-06: 30 via INTRAVENOUS

## 2012-09-06 MED ORDER — REGADENOSON 0.4 MG/5ML IV SOLN
0.4000 mg | Freq: Once | INTRAVENOUS | Status: AC
  Administered 2012-09-06: 0.4 mg via INTRAVENOUS

## 2012-09-06 NOTE — Progress Notes (Signed)
[name] [name], [name] [id number]    Cardiology Nuclear Med Study  Chauntae Hults Almario is a 72 y.o. female     MRN : [id number]     DOB: [birth year] [name], [name]  Procedure Date: 09/06/2012  Nuclear Med Background Indication for Stress Test:  Evaluation for Ischemia History:  '08 UXL:[id number], EF=79%. Cardiac Risk Factors: Family History - CAD, Hypertension, IDDM Type 2 and Obesity  Symptoms:  Chest Pressure.  (last episode of chest discomfort was this am, 5/10), Dizziness, DOE and Rapid HR   Nuclear Pre-Procedure Caffeine/Decaff Intake:  None > 12 hrs NPO After: 8:30pm   Lungs:  Clear. O2 Sat: 96% on room air. IV 0.9% NS with Angio Cath:  22g  IV Site: R Antecubital x 1, tolerated well IV Started by:  Irean Hong, RN  Chest Size (in):  48 Cup Size: DDD  Height: 5\' 9"  (1.753 m)  Weight:  267 lb (121.11 kg)  BMI:  Body mass index is 39.43 kg/(m^2). Tech Comments:  No insulin or diabetic medications today. Last dose Coreg 4:00 pm yesterday. 13 Hr PC CBG was 282 at 9:30 am today. Irean Hong, RN.    Nuclear Med Study 1 or 2 day study: 2 day  Stress Test Type:  Lexiscan  Reading MD: Charlton Haws, MD  Order Authorizing Provider:  Rollene Rotunda, MD  Resting Radionuclide: Technetium 44m Sestamibi  Resting Radionuclide Dose: 33.0 mCi  09/07/12  Stress Radionuclide:  Technetium 64m Sestamibi  Stress Radionuclide Dose: 33.0 mCi  09/06/12          Stress Protocol Rest HR: 87 Stress HR: 112  Rest BP: 144/84 Stress BP: 150/61  Exercise Time (min): n/a METS: n/a   Predicted Max HR: 148 bpm % Max HR: 75.68 bpm Rate Pressure Product: 27253    Dose of Adenosine (mg):  n/a Dose of Lexiscan: 0.4 mg  Dose of Atropine (mg): n/a Dose of Dobutamine: n/a mcg/kg/min (at max HR)  Stress Test Technologist: Smiley Houseman, CMA-N  Nuclear Technologist:  Doyne Keel, CNMT     Rest Procedure:  Myocardial perfusion imaging was performed at rest 45  minutes following the intravenous administration of Technetium 47m Sestamibi.  Rest ECG: NSR with low voltage.  Stress Procedure:  The patient received IV Lexiscan 0.4 mg over 15-seconds.  Technetium 30m Sestamibi injected at 30-seconds. She denied any chest pain.  Quantitative spect images were obtained after a 45 minute delay.  Stress ECG: No significant ST segment change suggestive of ischemia.  QPS Raw Data Images:  Acquisition technically good; normal left ventricular size. Stress Images:  Normal homogeneous uptake in all areas of the myocardium. Rest Images:  Normal homogeneous uptake in all areas of the myocardium. Subtraction (SDS):  No evidence of ischemia. Transient Ischemic Dilatation (Normal <1.22):  0.92 Lung/Heart Ratio (Normal <0.45):  0.36  Quantitative Gated Spect Images QGS EDV:  64 ml QGS ESV:  11 ml  Impression Exercise Capacity:  Lexiscan with no exercise. BP Response:  Normal blood pressure response. Clinical Symptoms:  No chest pain or dyspnea. ECG Impression:  No significant ST segment change suggestive of ischemia. Comparison with Prior Nuclear Study: No images to compare  Overall Impression:  Normal stress nuclear study.  LV Ejection Fraction: 82%.  LV Wall Motion:  NL LV Function; NL Wall Motion  Olga Millers

## 2012-09-07 ENCOUNTER — Ambulatory Visit (HOSPITAL_COMMUNITY): Payer: Medicare Other | Attending: Cardiology

## 2012-09-07 DIAGNOSIS — R0989 Other specified symptoms and signs involving the circulatory and respiratory systems: Secondary | ICD-10-CM

## 2012-09-07 MED ORDER — TECHNETIUM TC 99M SESTAMIBI GENERIC - CARDIOLITE
33.0000 | Freq: Once | INTRAVENOUS | Status: AC | PRN
  Administered 2012-09-07: 33 via INTRAVENOUS

## 2012-11-23 ENCOUNTER — Telehealth: Payer: Self-pay | Admitting: Cardiology

## 2012-11-23 NOTE — Telephone Encounter (Signed)
Pt called to have the last stress test done in this office to be mail to Dr. Molly Maduro Doe. Stress test results send. Pt aware .

## 2012-11-23 NOTE — Telephone Encounter (Signed)
New Problem:    Patient called in wanting to know if we would be able to mail a copy of her latest Stress Test to Dr. Molly Maduro Doe at [address] [place], [place].  Please call back.

## 2013-05-31 ENCOUNTER — Encounter: Payer: Self-pay | Admitting: Podiatrist

## 2013-05-31 ENCOUNTER — Ambulatory Visit (INDEPENDENT_AMBULATORY_CARE_PROVIDER_SITE_OTHER): Payer: Medicare Other | Admitting: Podiatrist

## 2013-05-31 VITALS — BP 147/72 | HR 72 | Resp 16 | Ht 68.0 in

## 2013-05-31 DIAGNOSIS — E1149 Type 2 diabetes mellitus with other diabetic neurological complication: Secondary | ICD-10-CM

## 2013-05-31 DIAGNOSIS — Q828 Other specified congenital malformations of skin: Secondary | ICD-10-CM

## 2013-05-31 DIAGNOSIS — E1159 Type 2 diabetes mellitus with other circulatory complications: Secondary | ICD-10-CM

## 2013-05-31 DIAGNOSIS — E1142 Type 2 diabetes mellitus with diabetic polyneuropathy: Secondary | ICD-10-CM

## 2013-05-31 DIAGNOSIS — M216X9 Other acquired deformities of unspecified foot: Secondary | ICD-10-CM

## 2013-05-31 NOTE — Progress Notes (Signed)
Chief Complaint  Patient presents with  . Callouses    hurting and burning and no draining and on ball of foot under pinkie toe     HPI: Patient is 72 y.o. female who presents today for diabetic foot check and recheck of hyperkeratotic lesion/preulcerative lesion submet 5 right foot.  Patient relates mild pain lateral left foot as well with no new injury reported     Physical Exam  GENERAL APPEARANCE: Alert, conversant. Appropriately groomed. No acute distress.  VASCULAR: Pedal pulses palpable 1/4 bilateral.  Capillary refill time is immediate to all digits,  Proximal to distal cooling it warm to warm.  Digital hair growth is present bilateral  NEUROLOGIC: sensation is decreased bilateral to light touch, vibration and 5.07 monofilament at 5/10 sites b/l MUSCULOSKELETAL: acceptable muscle strength, tone and stability bilateral.  Intrinsic muscluature intact bilateral.  Pes planus with prominent plantarflexed metatarsals and fat pad atrophy noted.  Specifically submet 5 right.  No clinical abnormality seen left on lateral side DERMATOLOGIC: skin color, texture, and turger are decreased.  + preulcerative lesion noted submetatarsal 5 right foot  Assessment: Diabetes with neuropathy and class findings, prominent plantarflexed metatarsal 5th right, porokeratotic lesion submet 5 right  Plan:  Debrided lesion with #15 blade without complication.  Reappoint in 1 month interval for continued care

## 2013-05-31 NOTE — Patient Instructions (Signed)

## 2013-06-28 ENCOUNTER — Ambulatory Visit: Payer: Medicare Other | Admitting: Podiatrist

## 2015-01-09 ENCOUNTER — Ambulatory Visit (INDEPENDENT_AMBULATORY_CARE_PROVIDER_SITE_OTHER): Payer: Medicare Other | Admitting: Cardiology

## 2015-01-09 ENCOUNTER — Encounter: Payer: Self-pay | Admitting: Cardiology

## 2015-01-09 VITALS — BP 152/64 | HR 109 | Ht 68.0 in | Wt 264.0 lb

## 2015-01-09 DIAGNOSIS — R0602 Shortness of breath: Secondary | ICD-10-CM

## 2015-01-09 NOTE — Patient Instructions (Signed)
Your physician recommends that you schedule a follow-up appointment in: 3 months with Dr. Antoine PocheHochrein  We are ordering labs and a stress test for you to get done

## 2015-01-09 NOTE — Progress Notes (Addendum)
HPI The patient presents for follow up of dyspnea. It has been about 2 and half years since I last saw her. She has had a negative stress perfusion study for evaluation of dyspnea past. She describes an episode of diastolic dysfunction. Since I last saw her she has had increasing shortness of breath. She says this is somewhat intermittent. She's not describing PND or orthopnea. She is limited somewhat by joint pains. However, when she does some walking just a moderate distance on level ground she will episodically get short of breath. She's not describing with this chest pressure, neck or arm discomfort. She does have some rapid heart rates with her exercise but no resting palpitations, presyncope or syncope. It looks like her weights have been relatively stable. She doesn't correlate this with any increased salt intake. However, she does say that it is getting progressive.  Current Outpatient Prescriptions  Medication Sig Dispense Refill  . carvedilol (COREG) 25 MG tablet Take 25 mg by mouth 2 (two) times daily with a meal.      . glimepiride (AMARYL) 4 MG tablet Take 4 mg by mouth 2 (two) times daily.      . insulin glargine (LANTUS) 100 UNIT/ML injection Inject 30 Units into the skin at bedtime.      . Insulin Lispro Prot & Lispro (HUMALOG MIX 50/50 PEN [place]) Inject 25 Units into the skin 2 (two) times daily.      Marland Kitchen. levothyroxine (SYNTHROID, LEVOTHROID) 50 MCG tablet Take 50 mcg by mouth daily.      . metFORMIN (GLUCOPHAGE-XR) 500 MG 24 hr tablet Take 1,000 mg by mouth 2 (two) times daily.      Marland Kitchen. NOVOFINE 30G X 8 MM MISC     . omeprazole (PRILOSEC) 20 MG capsule Take 20 mg by mouth daily.      Marland Kitchen. torsemide (DEMADEX) 20 MG tablet     . valsartan-hydrochlorothiazide (DIOVAN-HCT) 320-25 MG per tablet Take 1 tablet by mouth daily.       No current facility-administered medications for this visit.    Past Medical History  Diagnosis Date  . Diabetes mellitus   . Hypothyroidism   . Hypertension     . Arthritis     Past Surgical History  Procedure Laterality Date  . Ankle surgery      right  . Knee arthroscopy      left  . Total hip arthroplasty      left and right  . Eye surgery    . Anterior cruciate ligament repair      right  . Cholecystectomy    . Cataract extraction      ROS:  As stated in the HPI and negative for all other systems.  PHYSICAL EXAM BP 152/64 mmHg  Pulse 109  Ht 5\' 8"  (1.727 m)  Wt 264 lb (119.75 kg)  BMI 40.15 kg/m2  SpO2 97% GENERAL:  Well appearing NECK:  No jugular venous distention, waveform within normal limits, carotid upstroke brisk and symmetric, no bruits, no thyromegaly LYMPHATICS:  No cervical, inguinal adenopathy LUNGS:  Clear to auscultation bilaterally BACK:  No CVA tenderness CHEST:  Unremarkable HEART:  PMI not displaced or sustained,S1 and S2 within normal limits, no S3, no S4, no clicks, no rubs, no murmurs ABD:  Flat, positive bowel sounds normal in frequency in pitch, no bruits, no rebound, no guarding, no midline pulsatile mass, no hepatomegaly, no splenomegaly EXT:  2 plus pulses throughout, mild right greater than left bilateral ankle edema,  no cyanosis no clubbing  EKG: Sinus rhythm rate 109 low-voltage in the chest leads, poor anterior R wave progression, no acute ST-T wave changes, she does have QT prolongation mildly. 01/09/2015   ASSESSMENT AND PLAN  DYSPNEA -  This remains a chronic problem but progressive   I will check a BNP.  This could always be an anginal equivalent patient with significant risk factors including diabetes. Stress testing is indicated. However, because of her joints she would not be able to give me an adequate exercise. Therefore, she will have a  [employer].  OBESITY, UNSPECIFIED -  We have discussed this at length in the past.   ESSENTIAL HYPERTENSION, MALIGNANT -  Her blood pressure at home is typically well controlled. No change in therapy is indicated. [facility]  [facility] (therapeutic lifestyle changes).   AODM -  Per Dina RichUGH,ROBERT, MD .

## 2015-01-10 LAB — BRAIN NATRIURETIC PEPTIDE: Brain Natriuretic Peptide: 40.6 pg/mL (ref 0.0–100.0)

## 2015-02-01 ENCOUNTER — Telehealth (HOSPITAL_COMMUNITY): Payer: Self-pay

## 2015-02-01 NOTE — Telephone Encounter (Signed)
Encounter complete. 

## 2015-02-06 ENCOUNTER — Encounter (HOSPITAL_COMMUNITY): Payer: Medicare Other

## 2015-02-07 ENCOUNTER — Inpatient Hospital Stay (HOSPITAL_COMMUNITY): Admission: RE | Admit: 2015-02-07 | Payer: Medicare Other | Source: Ambulatory Visit

## 2015-02-08 NOTE — Telephone Encounter (Signed)
Encounter complete. 

## 2015-03-02 ENCOUNTER — Telehealth (HOSPITAL_COMMUNITY): Payer: Self-pay

## 2015-03-02 NOTE — Telephone Encounter (Signed)
Encounter complete. 

## 2015-03-07 ENCOUNTER — Ambulatory Visit (HOSPITAL_COMMUNITY)
Admission: RE | Admit: 2015-03-07 | Discharge: 2015-03-07 | Disposition: A | Discharge status: Home / Self Care | Payer: Medicare Other | Source: Ambulatory Visit | Attending: Cardiology | Admitting: Cardiology

## 2015-03-07 DIAGNOSIS — R0602 Shortness of breath: Secondary | ICD-10-CM | POA: Insufficient documentation

## 2015-03-07 DIAGNOSIS — I10 Essential (primary) hypertension: Secondary | ICD-10-CM | POA: Diagnosis not present

## 2015-03-07 DIAGNOSIS — E119 Type 2 diabetes mellitus without complications: Secondary | ICD-10-CM | POA: Diagnosis not present

## 2015-03-07 DIAGNOSIS — E669 Obesity, unspecified: Secondary | ICD-10-CM | POA: Diagnosis not present

## 2015-03-07 DIAGNOSIS — Z6841 Body Mass Index (BMI) 40.0 and over, adult: Secondary | ICD-10-CM | POA: Diagnosis not present

## 2015-03-07 DIAGNOSIS — Z8249 Family history of ischemic heart disease and other diseases of the circulatory system: Secondary | ICD-10-CM | POA: Diagnosis not present

## 2015-03-07 DIAGNOSIS — R55 Syncope and collapse: Secondary | ICD-10-CM | POA: Diagnosis not present

## 2015-03-07 DIAGNOSIS — R079 Chest pain, unspecified: Secondary | ICD-10-CM | POA: Insufficient documentation

## 2015-03-07 MED ORDER — AMINOPHYLLINE 25 MG/ML IV SOLN
100.0000 mg | Freq: Once | INTRAVENOUS | Status: AC
  Administered 2015-03-07: 100 mg via INTRAVENOUS

## 2015-03-07 MED ORDER — TECHNETIUM TC 99M SESTAMIBI GENERIC - CARDIOLITE
31.8000 | Freq: Once | INTRAVENOUS | Status: AC | PRN
  Administered 2015-03-07: 31.8 via INTRAVENOUS

## 2015-03-07 MED ORDER — REGADENOSON 0.4 MG/5ML IV SOLN
0.4000 mg | Freq: Once | INTRAVENOUS | Status: AC
  Administered 2015-03-07: 0.4 mg via INTRAVENOUS

## 2015-03-08 ENCOUNTER — Ambulatory Visit (HOSPITAL_COMMUNITY)
Admission: RE | Admit: 2015-03-08 | Discharge: 2015-03-08 | Disposition: A | Discharge status: Home / Self Care | Payer: Medicare Other | Source: Ambulatory Visit | Attending: Cardiology | Admitting: Cardiology

## 2015-03-08 MED ORDER — TECHNETIUM TC 99M SESTAMIBI GENERIC - CARDIOLITE
30.9000 | Freq: Once | INTRAVENOUS | Status: AC | PRN
Start: 2015-03-08 — End: 2015-03-08
  Administered 2015-03-08: 30.9 via INTRAVENOUS

## 2015-03-09 LAB — MYOCARDIAL PERFUSION IMAGING
CHL CUP RESTING HR STRESS: 85 {beats}/min
CSEPPHR: 102 {beats}/min
LV dias vol: 53 mL
LV sys vol: 10 mL
SDS: 4
SRS: 0
SSS: 4
TID: 0.74

## 2015-04-17 ENCOUNTER — Ambulatory Visit (INDEPENDENT_AMBULATORY_CARE_PROVIDER_SITE_OTHER): Payer: Medicare Other | Admitting: Cardiology

## 2015-04-17 ENCOUNTER — Encounter: Payer: Self-pay | Admitting: Cardiology

## 2015-04-17 VITALS — BP 178/66 | HR 100 | Ht 68.75 in | Wt 246.3 lb

## 2015-04-17 DIAGNOSIS — R0600 Dyspnea, unspecified: Secondary | ICD-10-CM

## 2015-04-17 NOTE — Progress Notes (Signed)
HPI The patient presents for follow up of dyspnea.  At the last visit I ordered a stress perfusion testing which was negative for any evidence of ischemia with a well preserved ejection fraction. A BNP was normal. Since then she thinks that stopping the diet ginger ale she was drinking has improved her breathing. She's no longer having acute shortness of breath and she's not had any PND or orthopnea. She has less lower extremity swelling did have. She's not having any chest pressure, neck or arm discomfort. She's not having any palpitations, presyncope or syncope.   Current Outpatient Prescriptions  Medication Sig Dispense Refill  . carvedilol (COREG) 25 MG tablet Take 25 mg by mouth 2 (two) times daily with a meal.      . glimepiride (AMARYL) 4 MG tablet Take 4 mg by mouth daily with breakfast.    . HUMALOG MIX 50/50 KWIKPEN (50-50) 100 UNIT/ML Kwikpen Inject 30 Units as directed 2 (two) times daily. Inject 30 units into skin twice a day  99  . insulin glargine (LANTUS) 100 UNIT/ML injection Inject 40 Units into the skin at bedtime.     Marland Kitchen levothyroxine (SYNTHROID, LEVOTHROID) 50 MCG tablet Take 50 mcg by mouth daily.      . metFORMIN (GLUCOPHAGE-XR) 500 MG 24 hr tablet Take 1,000 mg by mouth 2 (two) times daily.      . Multiple Vitamins-Minerals (MACULAR VITAMIN BENEFIT PO) Take 2 tablets by mouth daily.    . NON FORMULARY Take 1 capsule by mouth daily. VEIN EASE DIETARY  SUPPLEMENT    . NOVOFINE 30G X 8 MM MISC     . omeprazole (PRILOSEC) 20 MG capsule Take 20 mg by mouth daily.      Marland Kitchen torsemide (DEMADEX) 20 MG tablet Take 20 mg by mouth daily.     . valsartan-hydrochlorothiazide (DIOVAN-HCT) 160-25 MG per tablet Take 1 tablet by mouth daily. Take 1 tab daily  5   No current facility-administered medications for this visit.    Past Medical History  Diagnosis Date  . Diabetes mellitus   . Hypothyroidism   . Hypertension   . Arthritis     Past Surgical History  Procedure Laterality  Date  . Ankle surgery      right  . Knee arthroscopy      left  . Total hip arthroplasty      left and right  . Eye surgery    . Anterior cruciate ligament repair      right  . Cholecystectomy    . Cataract extraction      ROS:  As stated in the HPI and negative for all other systems.  PHYSICAL EXAM BP 178/66 mmHg  Pulse 100  Ht 5' 8.75" (1.746 m)  Wt 246 lb 5 oz (111.727 kg)  BMI 36.65 kg/m2 GENERAL:  Well appearing NECK:  No jugular venous distention, waveform within normal limits, carotid upstroke brisk and symmetric, no bruits, no thyromegaly LUNGS:  Diffuse fine crackles BACK:  No CVA tenderness CHEST:  Unremarkable HEART:  PMI not displaced or sustained,S1 and S2 within normal limits, no S3, no S4, no clicks, no rubs, no murmurs ABD:  Flat, positive bowel sounds normal in frequency in pitch, no bruits, no rebound, no guarding, no midline pulsatile mass, no hepatomegaly, no splenomegaly EXT:  2 plus pulses throughout, mild right greater than left bilateral ankle edema mild, no cyanosis no clubbing, chronic venous stasis changes with rash   ASSESSMENT AND PLAN  DYSPNEA -  With a normal BNP and negative stress test I am not inclined to think there is a cardiac etiology. Could be weight and deconditioning. She does have dry crackles on exam but she reports having had recent chest x-ray and pulmonary function testing. I don't have these records and I will defer to Dr. Juleen China.  No further cardiac workup is suggested.  OBESITY, UNSPECIFIED -  We have discussed this at length in the past.   ESSENTIAL HYPERTENSION, MALIGNANT -  Her blood pressure at home is typically well controlled. No change in therapy is indicated. She needs continued attempts at [facility] (therapeutic lifestyle changes).   AODM -  Per Michiel Sites, MD .

## 2015-04-17 NOTE — Patient Instructions (Signed)
Your physician recommends that you schedule a follow-up appointment As Needed  

## 2015-07-03 ENCOUNTER — Encounter: Payer: Self-pay | Admitting: Cardiology

## 2015-07-03 ENCOUNTER — Ambulatory Visit (INDEPENDENT_AMBULATORY_CARE_PROVIDER_SITE_OTHER): Payer: Medicare Other | Admitting: Cardiology

## 2015-07-03 VITALS — BP 118/58 | HR 80 | Ht 68.75 in | Wt 229.7 lb

## 2015-07-03 DIAGNOSIS — R55 Syncope and collapse: Secondary | ICD-10-CM

## 2015-07-03 NOTE — Patient Instructions (Signed)
Your physician recommends that you schedule a follow-up appointment in: 6 weeks  

## 2015-07-03 NOTE — Progress Notes (Signed)
HPI The patient presents for follow up of syncope. She was hospitalized at [facility] in October and tells me she had no potassium and magnesium and anemia. The etiology of this was not clear. I unfortunately don't have any of that workup. She went home. On October 12 oh walking across her living room she had frank syncope. She thinks she was out for about an hour. She had facial trauma breaking several bones in her face. She's not had any syncope prior to this and has had none since. She denies any orthostatic symptoms though she gets a very slowly. She's getting around with a walker. She's not noticing any palpitations. She has no chest pressure, neck or arm discomfort. Chronic dyspnea and she had previously is much improved.  Current Outpatient Prescriptions  Medication Sig Dispense Refill  . carvedilol (COREG) 25 MG tablet Take 25 mg by mouth 2 (two) times daily with a meal.      . glimepiride (AMARYL) 4 MG tablet Take 4 mg by mouth daily with breakfast.    . HUMALOG MIX 50/50 KWIKPEN (50-50) 100 UNIT/ML Kwikpen Inject 30 Units as directed 2 (two) times daily. Inject 30 units into skin twice a day  99  . insulin glargine (LANTUS) 100 UNIT/ML injection Inject 40 Units into the skin at bedtime.     . Magnesium Oxide 400 (240 MG) MG TABS Take 400 mg by mouth daily.  0  . metFORMIN (GLUCOPHAGE-XR) 500 MG 24 hr tablet Take 1,000 mg by mouth 2 (two) times daily.      . Multiple Vitamins-Minerals (MACULAR VITAMIN BENEFIT PO) Take 2 tablets by mouth daily.    . NON FORMULARY Take 1 capsule by mouth daily. VEIN EASE DIETARY  SUPPLEMENT    . NOVOFINE 30G X 8 MM MISC     . omeprazole (PRILOSEC) 20 MG capsule Take 20 mg by mouth daily.      . potassium chloride SA (K-DUR,KLOR-CON) 20 MEQ tablet Take 20 mEq by mouth daily.  0  . SYNTHROID 75 MCG tablet Take 75 mcg by mouth daily.  0  . torsemide (DEMADEX) 20 MG tablet Take 20 mg by mouth daily.     . valsartan (DIOVAN) 160 MG tablet Take 160 mg  by mouth daily.  99   No current facility-administered medications for this visit.    Past Medical History  Diagnosis Date  . Diabetes mellitus   . Hypothyroidism   . Hypertension   . Arthritis     Past Surgical History  Procedure Laterality Date  . Ankle surgery      right  . Knee arthroscopy      left  . Total hip arthroplasty      left and right  . Eye surgery    . Anterior cruciate ligament repair      right  . Cholecystectomy    . Cataract extraction      ROS:  As stated in the HPI and negative for all other systems.  PHYSICAL EXAM BP 118/58 mmHg  Pulse 80  Ht 5' 8.75" (1.746 m)  Wt 229 lb 11.2 oz (104.191 kg)  BMI 34.18 kg/m2 GENERAL:  Frail appearing HEAD:  PERRL, bruising  NECK:  No jugular venous distention, waveform within normal limits, carotid upstroke brisk and symmetric, no bruits, no thyromegaly LUNGS:  Diffuse fine crackles BACK:  No CVA tenderness CHEST:  Unremarkable HEART:  PMI not displaced or sustained,S1 and S2 within normal limits, no S3, no S4, no  clicks, no rubs, no murmurs ABD:  Flat, positive bowel sounds normal in frequency in pitch, no bruits, no rebound, no guarding, no midline pulsatile mass, no hepatomegaly, no splenomegaly EXT:  2 plus pulses throughout, mild right greater than left bilateral ankle edema mild, no cyanosis no clubbing, chronic venous stasis changes NEURO:  Nonfocal  EKG:  Sinus rhythm, rate 80, axis within normal limits, intervals within normal limits, incomplete right bundle branch block, no acute ST-T wave changes, low voltage in the chest leads.  No significant change from previous.    ASSESSMENT AND PLAN  SYNCOPE - The etiology of this is not clear.  She did not have an orthostatic blood pressure drop.  I will await the results of the event monitor that she is currently wearing.  I will also be reviewing records from [facility] when these become available  DYSPNEA -  This is much improved.  No change to her  therapy is indicated.    OBESITY, UNSPECIFIED -  She's lost about 30 pounds with her illness. She should continue with controlled weight loss.  ESSENTIAL HYPERTENSION, MALIGNANT -  Her blood pressure at home is well controlled. No change in therapy is indicated.   AODM -  Per Michiel SitesKOHUT,WALTER DENNIS, MD .

## 2015-08-14 ENCOUNTER — Encounter: Payer: Self-pay | Admitting: Cardiology

## 2015-08-14 ENCOUNTER — Ambulatory Visit (INDEPENDENT_AMBULATORY_CARE_PROVIDER_SITE_OTHER): Payer: Medicare Other | Admitting: Cardiology

## 2015-08-14 VITALS — BP 138/82 | HR 88 | Ht 68.0 in | Wt 233.5 lb

## 2015-08-14 DIAGNOSIS — R55 Syncope and collapse: Secondary | ICD-10-CM | POA: Diagnosis not present

## 2015-08-14 NOTE — Patient Instructions (Signed)
Your physician recommends that you schedule a follow-up appointment in: As Needed  Merry Christmas and Happy New Year!! 

## 2015-08-14 NOTE — Progress Notes (Signed)
HPI The patient presents for follow up of syncope. She was hospitalized at [facility] in October and tells me she had low potassium and magnesium and anemia. She followed up with me. She was found on an event monitor to have occasional short runs of narrow complex tachycardia but no atrial fibrillation. No bradycardia arrhythmias. There were no sustained arrhythmias. Since that time she has felt well. She denies any further cardiovascular symptoms. She's had no chest pressure, neck or arm discomfort. Said no palpitations, presyncope or syncope. She's had no dyspnea.  Current Outpatient Prescriptions  Medication Sig Dispense Refill  . carvedilol (COREG) 25 MG tablet Take 25 mg by mouth 2 (two) times daily with a meal.      . glimepiride (AMARYL) 4 MG tablet Take 4 mg by mouth daily with breakfast.    . HUMALOG MIX 50/50 KWIKPEN (50-50) 100 UNIT/ML Kwikpen Inject 30 Units as directed 2 (two) times daily. Inject 30 units into skin twice a day  99  . insulin glargine (LANTUS) 100 UNIT/ML injection Inject 40 Units into the skin at bedtime.     . Magnesium Oxide 400 (240 MG) MG TABS Take 400 mg by mouth daily.  0  . metFORMIN (GLUCOPHAGE-XR) 500 MG 24 hr tablet Take 1,000 mg by mouth 2 (two) times daily.      . Multiple Vitamins-Minerals (MACULAR VITAMIN BENEFIT PO) Take 2 tablets by mouth daily.    . NON FORMULARY Take 1 capsule by mouth daily. VEIN EASE DIETARY  SUPPLEMENT    . NOVOFINE 30G X 8 MM MISC     . omeprazole (PRILOSEC) 20 MG capsule Take 20 mg by mouth daily.      Marland Kitchen SYNTHROID 75 MCG tablet Take 75 mcg by mouth daily.  0  . torsemide (DEMADEX) 20 MG tablet Take 20 mg by mouth daily.     . valsartan (DIOVAN) 160 MG tablet Take 160 mg by mouth daily.  99  . Vitamin D, Ergocalciferol, (DRISDOL) 50000 UNITS CAPS capsule Take 50,000 Units by mouth once a week.  3   No current facility-administered medications for this visit.    Past Medical History  Diagnosis Date  . Diabetes  mellitus   . Hypothyroidism   . Hypertension   . Arthritis     Past Surgical History  Procedure Laterality Date  . Ankle surgery      right  . Knee arthroscopy      left  . Total hip arthroplasty      left and right  . Eye surgery    . Anterior cruciate ligament repair      right  . Cholecystectomy    . Cataract extraction      ROS:  As stated in the HPI and negative for all other systems.  PHYSICAL EXAM BP 138/82 mmHg  Pulse 88  Ht  (1.727 m)  Wt 233 lb 8 oz (105.915 kg)  BMI 35.51 kg/m2  SpO2 98% GENERAL:  Frail appearing NECK:  No jugular venous distention, waveform within normal limits, carotid upstroke brisk and symmetric, no bruits, no thyromegaly LUNGS:  Diffuse fine crackles CHEST:  Unremarkable HEART:  PMI not displaced or sustained,S1 and S2 within normal limits, no S3, no S4, no clicks, no rubs, no murmurs ABD:  Flat, positive bowel sounds normal in frequency in pitch, no bruits, no rebound, no guarding, no midline pulsatile mass, no hepatomegaly, no splenomegaly EXT:  2 plus pulses throughout, mild right greater than left bilateral  ankle edema mild, no cyanosis no clubbing, chronic venous stasis changes   ASSESSMENT AND PLAN  SYNCOPE - I suspect that this was related to an orthostatic blood pressure drop.  She has had no further events.  No change in therapy is indicated.    DYSPNEA -  This is much improved.  No change to her therapy is indicated.    ESSENTIAL HYPERTENSION, MALIGNANT -  Her blood pressure at home is well controlled. No change in therapy is indicated.   AODM -  Per Michiel SitesKOHUT,WALTER DENNIS, MD .  Last A1C was 8.3

## 2015-09-03 ENCOUNTER — Encounter: Payer: Self-pay | Admitting: Cardiology

## 2016-01-05 IMAGING — NM NM MISC PROCEDURE
6 series · 36 of 36 positions shown · non-contrast
Comparison: none

[Series 1: wbr_s-proj_st wbr stress-gsp · 6.40mm/px · 6 of 512 frames shown]
[frame 43/512]
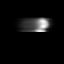
[frame 128/512]
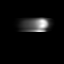
[frame 214/512]
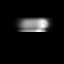
[frame 299/512]
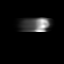
[frame 384/512]
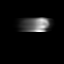
[frame 470/512]
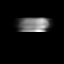

[Series 1: wbr stress-gsp · 6.40mm/px · 6 of 503 frames shown]
[frame 42/503  full-range]
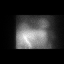
[frame 126/503  full-range]
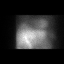
[frame 210/503  full-range]
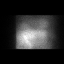
[frame 294/503  full-range]
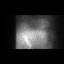
[frame 378/503  full-range]
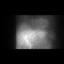
[frame 462/503  full-range]
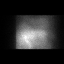

[Series 2: wbr_s-proj_st wbr stress-sum-em · 6.40mm/px · 6 of 64 frames shown]
[frame 6/64]
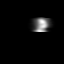
[frame 16/64]
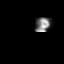
[frame 27/64]
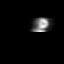
[frame 38/64]
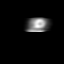
[frame 48/64]
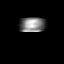
[frame 59/64]
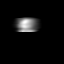

[Series 2: wbr stress-sum-em · 6.40mm/px · 6 of 64 frames shown]
[frame 6/64]
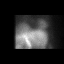
[frame 16/64]
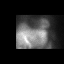
[frame 27/64]
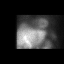
[frame 38/64]
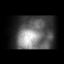
[frame 48/64]
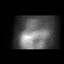
[frame 59/64]
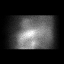

[Series 3: wbr rest · 6.40mm/px · 6 of 64 frames shown]
[frame 6/64]
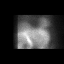
[frame 16/64]
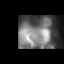
[frame 27/64]
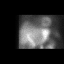
[frame 38/64]
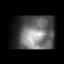
[frame 48/64]
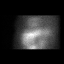
[frame 59/64]
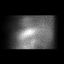

[Series 3: wbr_r-proj_st wbr rest · 6.40mm/px · 6 of 64 frames shown]
[frame 6/64]
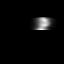
[frame 16/64]
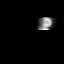
[frame 27/64]
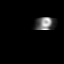
[frame 38/64]
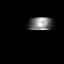
[frame 48/64]
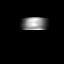
[frame 59/64]
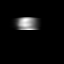

[36 of 36 positions shown; findings below may reference images not displayed]

Canned report from images found in remote index.

Refer to host system for actual result text.

## 2016-07-31 DIAGNOSIS — I5030 Unspecified diastolic (congestive) heart failure: Secondary | ICD-10-CM

## 2016-07-31 DIAGNOSIS — M109 Gout, unspecified: Secondary | ICD-10-CM | POA: Diagnosis not present

## 2016-07-31 DIAGNOSIS — E039 Hypothyroidism, unspecified: Secondary | ICD-10-CM

## 2016-07-31 DIAGNOSIS — R079 Chest pain, unspecified: Secondary | ICD-10-CM | POA: Diagnosis not present

## 2016-07-31 DIAGNOSIS — I10 Essential (primary) hypertension: Secondary | ICD-10-CM | POA: Diagnosis not present

## 2016-07-31 DIAGNOSIS — E119 Type 2 diabetes mellitus without complications: Secondary | ICD-10-CM

## 2016-08-01 DIAGNOSIS — E119 Type 2 diabetes mellitus without complications: Secondary | ICD-10-CM | POA: Diagnosis not present

## 2016-08-01 DIAGNOSIS — I10 Essential (primary) hypertension: Secondary | ICD-10-CM | POA: Diagnosis not present

## 2016-08-01 DIAGNOSIS — M109 Gout, unspecified: Secondary | ICD-10-CM | POA: Diagnosis not present

## 2016-08-01 DIAGNOSIS — R079 Chest pain, unspecified: Secondary | ICD-10-CM | POA: Diagnosis not present

## 2019-12-25 DIAGNOSIS — I361 Nonrheumatic tricuspid (valve) insufficiency: Secondary | ICD-10-CM | POA: Diagnosis not present

## 2019-12-25 DIAGNOSIS — E119 Type 2 diabetes mellitus without complications: Secondary | ICD-10-CM

## 2019-12-25 DIAGNOSIS — I509 Heart failure, unspecified: Secondary | ICD-10-CM

## 2019-12-25 DIAGNOSIS — I340 Nonrheumatic mitral (valve) insufficiency: Secondary | ICD-10-CM

## 2019-12-25 DIAGNOSIS — R079 Chest pain, unspecified: Secondary | ICD-10-CM

## 2019-12-25 DIAGNOSIS — R0902 Hypoxemia: Secondary | ICD-10-CM

## 2019-12-26 DIAGNOSIS — E119 Type 2 diabetes mellitus without complications: Secondary | ICD-10-CM | POA: Diagnosis not present

## 2019-12-26 DIAGNOSIS — R079 Chest pain, unspecified: Secondary | ICD-10-CM

## 2019-12-26 DIAGNOSIS — I509 Heart failure, unspecified: Secondary | ICD-10-CM | POA: Diagnosis not present

## 2019-12-26 DIAGNOSIS — R0902 Hypoxemia: Secondary | ICD-10-CM | POA: Diagnosis not present

## 2019-12-27 DIAGNOSIS — I509 Heart failure, unspecified: Secondary | ICD-10-CM | POA: Diagnosis not present

## 2019-12-27 DIAGNOSIS — E119 Type 2 diabetes mellitus without complications: Secondary | ICD-10-CM | POA: Diagnosis not present

## 2019-12-27 DIAGNOSIS — R079 Chest pain, unspecified: Secondary | ICD-10-CM | POA: Diagnosis not present

## 2019-12-27 DIAGNOSIS — R0902 Hypoxemia: Secondary | ICD-10-CM | POA: Diagnosis not present

## 2020-01-17 ENCOUNTER — Telehealth: Payer: Self-pay | Admitting: General Practice

## 2020-01-17 NOTE — Telephone Encounter (Signed)
Patient called stating she had seen Dr. Tomie China before and that she did not need a referral. I advised that I do not see any record of her seeing Dr. Tomie China but that she had seen Dr. Antoine Poche, It had been 5 years since she last saw him and we would still need that referral. She states she will have her PCP fax over the referral.

## 2020-01-18 NOTE — Telephone Encounter (Signed)
Patient is calling to follow up in regards to whether or not our office has received a referral from Dr. Sol Passer. I made the patient aware that we have not yet received a referral, however once we do receive it we will call to get her scheduled. Patient verbalized understanding.

## 2020-01-20 DIAGNOSIS — I2510 Atherosclerotic heart disease of native coronary artery without angina pectoris: Secondary | ICD-10-CM

## 2020-01-20 DIAGNOSIS — I10 Essential (primary) hypertension: Secondary | ICD-10-CM

## 2020-01-20 DIAGNOSIS — I442 Atrioventricular block, complete: Secondary | ICD-10-CM | POA: Diagnosis not present

## 2020-01-20 DIAGNOSIS — E119 Type 2 diabetes mellitus without complications: Secondary | ICD-10-CM

## 2020-01-20 DIAGNOSIS — R0609 Other forms of dyspnea: Secondary | ICD-10-CM

## 2020-02-01 ENCOUNTER — Ambulatory Visit: Payer: Medicare Other | Admitting: Cardiology

## 2020-02-23 DIAGNOSIS — R079 Chest pain, unspecified: Secondary | ICD-10-CM

## 2020-09-25 DEATH — deceased
